# Patient Record
Sex: Male | Born: 1960
Health system: Southern US, Community
[De-identification: ages and names within clinical notes are randomized; demographics above are authoritative.]

## PROBLEM LIST (undated history)

## (undated) DIAGNOSIS — T7840XA Allergy, unspecified, initial encounter: Secondary | ICD-10-CM

## (undated) DIAGNOSIS — E785 Hyperlipidemia, unspecified: Secondary | ICD-10-CM

## (undated) DIAGNOSIS — I10 Essential (primary) hypertension: Secondary | ICD-10-CM

## (undated) DIAGNOSIS — G4730 Sleep apnea, unspecified: Secondary | ICD-10-CM

## (undated) DIAGNOSIS — Z87442 Personal history of urinary calculi: Secondary | ICD-10-CM

## (undated) HISTORY — DX: Personal history of urinary calculi: Z87.442

## (undated) HISTORY — PX: LUMBAR LAMINECTOMY: SHX95

## (undated) HISTORY — DX: Allergy, unspecified, initial encounter: T78.40XA

## (undated) HISTORY — DX: Essential (primary) hypertension: I10

## (undated) HISTORY — DX: Sleep apnea, unspecified: G47.30

## (undated) HISTORY — PX: UVULECTOMY: SHX2631

## (undated) HISTORY — DX: Hyperlipidemia, unspecified: E78.5

## (undated) HISTORY — PX: APPENDECTOMY: SHX54

---

## 2003-12-07 ENCOUNTER — Ambulatory Visit (HOSPITAL_COMMUNITY): Admission: RE | Admit: 2003-12-07 | Discharge: 2003-12-07 | Payer: Self-pay | Admitting: Family Medicine

## 2004-02-09 ENCOUNTER — Encounter: Admission: RE | Admit: 2004-02-09 | Discharge: 2004-02-09 | Payer: Self-pay | Admitting: Otolaryngology

## 2004-02-10 ENCOUNTER — Ambulatory Visit (HOSPITAL_BASED_OUTPATIENT_CLINIC_OR_DEPARTMENT_OTHER): Admission: RE | Admit: 2004-02-10 | Discharge: 2004-02-10 | Payer: Self-pay | Admitting: *Deleted

## 2004-02-10 ENCOUNTER — Encounter (INDEPENDENT_AMBULATORY_CARE_PROVIDER_SITE_OTHER): Payer: Self-pay | Admitting: *Deleted

## 2004-02-10 ENCOUNTER — Ambulatory Visit (HOSPITAL_COMMUNITY): Admission: RE | Admit: 2004-02-10 | Discharge: 2004-02-10 | Payer: Self-pay | Admitting: *Deleted

## 2004-04-29 ENCOUNTER — Encounter: Admission: RE | Admit: 2004-04-29 | Discharge: 2004-04-29 | Payer: Self-pay | Admitting: Sports Medicine

## 2005-12-11 ENCOUNTER — Emergency Department (HOSPITAL_COMMUNITY): Admission: EM | Admit: 2005-12-11 | Discharge: 2005-12-11 | Payer: Self-pay | Admitting: Emergency Medicine

## 2005-12-12 ENCOUNTER — Encounter: Admission: RE | Admit: 2005-12-12 | Discharge: 2005-12-12 | Payer: Self-pay | Admitting: Family Medicine

## 2005-12-27 ENCOUNTER — Encounter: Admission: RE | Admit: 2005-12-27 | Discharge: 2005-12-27 | Payer: Self-pay | Admitting: Family Medicine

## 2006-01-19 ENCOUNTER — Encounter: Admission: RE | Admit: 2006-01-19 | Discharge: 2006-01-19 | Payer: Self-pay | Admitting: Sports Medicine

## 2006-02-15 ENCOUNTER — Ambulatory Visit (HOSPITAL_COMMUNITY): Admission: RE | Admit: 2006-02-15 | Discharge: 2006-02-16 | Payer: Self-pay | Admitting: Neurological Surgery

## 2006-11-27 ENCOUNTER — Encounter: Payer: Self-pay | Admitting: Pulmonary Disease

## 2008-03-17 ENCOUNTER — Ambulatory Visit: Payer: Self-pay | Admitting: Pulmonary Disease

## 2008-03-17 DIAGNOSIS — E785 Hyperlipidemia, unspecified: Secondary | ICD-10-CM | POA: Insufficient documentation

## 2008-03-17 DIAGNOSIS — J309 Allergic rhinitis, unspecified: Secondary | ICD-10-CM | POA: Insufficient documentation

## 2008-03-17 DIAGNOSIS — G4730 Sleep apnea, unspecified: Secondary | ICD-10-CM | POA: Insufficient documentation

## 2008-03-24 ENCOUNTER — Encounter: Payer: Self-pay | Admitting: Pulmonary Disease

## 2009-04-21 ENCOUNTER — Ambulatory Visit: Payer: Self-pay | Admitting: Family Medicine

## 2009-04-21 DIAGNOSIS — Z87442 Personal history of urinary calculi: Secondary | ICD-10-CM | POA: Insufficient documentation

## 2009-04-21 DIAGNOSIS — G4700 Insomnia, unspecified: Secondary | ICD-10-CM | POA: Insufficient documentation

## 2009-04-21 DIAGNOSIS — F5104 Psychophysiologic insomnia: Secondary | ICD-10-CM | POA: Insufficient documentation

## 2009-06-08 ENCOUNTER — Ambulatory Visit: Payer: Self-pay | Admitting: Family Medicine

## 2009-06-08 LAB — CONVERTED CEMR LAB
Glucose, Urine, Semiquant: NEGATIVE
Specific Gravity, Urine: 1.025

## 2009-06-11 LAB — CONVERTED CEMR LAB
Alkaline Phosphatase: 59 units/L (ref 39–117)
BUN: 13 mg/dL (ref 6–23)
Basophils Absolute: 0 10*3/uL (ref 0.0–0.1)
Direct LDL: 233.1 mg/dL
Eosinophils Relative: 1.8 % (ref 0.0–5.0)
GFR calc non Af Amer: 63.18 mL/min (ref >60)
Glucose, Bld: 78 mg/dL (ref 70–99)
Hemoglobin: 18.2 g/dL — ABNORMAL HIGH (ref 13.0–17.0)
Lymphocytes Relative: 20.7 % (ref 12.0–46.0)
MCHC: 32.4 g/dL (ref 30.0–36.0)
MCV: 96.7 fL (ref 78.0–100.0)
Monocytes Absolute: 0.6 10*3/uL (ref 0.1–1.0)
Monocytes Relative: 11.3 % (ref 3.0–12.0)
Neutrophils Relative %: 66 % (ref 43.0–77.0)
Platelets: 204 10*3/uL (ref 150.0–400.0)
Sodium: 139 meq/L (ref 135–145)
TSH: 1.47 microintl units/mL (ref 0.35–5.50)
Total Bilirubin: 1.9 mg/dL — ABNORMAL HIGH (ref 0.3–1.2)
Total CHOL/HDL Ratio: 13
Total Protein: 6.2 g/dL (ref 6.0–8.3)
Triglycerides: 82 mg/dL (ref 0.0–149.0)
VLDL: 16.4 mg/dL (ref 0.0–40.0)
WBC: 5 10*3/uL (ref 4.5–10.5)

## 2009-06-14 ENCOUNTER — Ambulatory Visit: Payer: Self-pay | Admitting: Family Medicine

## 2009-06-14 DIAGNOSIS — F411 Generalized anxiety disorder: Secondary | ICD-10-CM | POA: Insufficient documentation

## 2009-08-06 ENCOUNTER — Telehealth: Payer: Self-pay | Admitting: Family Medicine

## 2009-10-11 ENCOUNTER — Encounter: Payer: Self-pay | Admitting: Family Medicine

## 2009-10-15 ENCOUNTER — Ambulatory Visit: Payer: Self-pay | Admitting: Family Medicine

## 2009-10-15 DIAGNOSIS — I10 Essential (primary) hypertension: Secondary | ICD-10-CM | POA: Insufficient documentation

## 2009-10-22 ENCOUNTER — Encounter: Payer: Self-pay | Admitting: Family Medicine

## 2009-12-07 ENCOUNTER — Encounter: Payer: Self-pay | Admitting: Family Medicine

## 2010-04-08 ENCOUNTER — Encounter: Payer: Self-pay | Admitting: Family Medicine

## 2010-04-15 ENCOUNTER — Encounter: Admission: RE | Admit: 2010-04-15 | Discharge: 2010-04-15 | Payer: Self-pay | Admitting: Cardiology

## 2010-04-19 ENCOUNTER — Telehealth: Payer: Self-pay | Admitting: Family Medicine

## 2010-06-20 ENCOUNTER — Encounter: Payer: Self-pay | Admitting: Family Medicine

## 2010-10-19 ENCOUNTER — Telehealth: Payer: Self-pay | Admitting: Family Medicine

## 2010-10-22 ENCOUNTER — Encounter: Payer: Self-pay | Admitting: Endocrinology

## 2010-10-23 ENCOUNTER — Encounter: Payer: Self-pay | Admitting: Cardiology

## 2010-11-03 NOTE — Letter (Signed)
Summary: [facility]   Imported By: Maryln Gottron 10/20/2009 12:26:56  _____________________________________________________________________  External Attachment:    Type:   Image     Comment:   External Document

## 2010-11-03 NOTE — Assessment & Plan Note (Signed)
Summary: COUGH, CONGESTION // RS   Vital Signs:  Patient profile:   50 year old male Weight:      173 pounds Temp:     98.2 degrees F oral Pulse rate:   79 / minute BP sitting:   100 / 66  (left arm) Cuff size:   regular  Vitals Entered By: Alfred Levins, CMA (October 15, 2009 9:31 AM) CC: cough x2 wks   History of Present Illness: Here for 2 weeks of stuffy head, ST, chest congestion, and coughing up green sputum. No fever. Prior to this he developed some chest pains and saw Dr. Lynnea Ferrier at [facility]. These pains do not sound cardiac to me, but hey are doing a full work up. He was switched from Crestor to Zetia, and he was started on Bystolic for his BP. The pains are mild but sharp, they only last about 5 seconds, they are not related to exertion, and there are no associated symptoms. He is scheduled for a stress test and an ECHO soon. Since we increased his Zoloft to 100mg  a day, he has felt much more relaxed and happy.   Current Medications (verified): 1)  Trazodone Hcl 50 Mg Tabs (Trazodone Hcl) .... At Bedtime 2)  Zolpidem Tartrate 10 Mg Tabs (Zolpidem Tartrate) .... At Bedtime As Needed 3)  Zoloft 100 Mg Tabs (Sertraline Hcl) .Marland Kitchen.. 1 By Mouth Daily 4)  Zetia 10 Mg Tabs (Ezetimibe) .Marland Kitchen.. 1 By Mouth Daily 5)  Fish Oil 1000 Mg Caps (Omega-3 Fatty Acids) .... Once Daily 6)  Bystolic 10 Mg  Tabs (Nebivolol Hcl) .Marland Kitchen.. 1 By Mouth Daily  Allergies (verified): No Known Drug Allergies  Past History:  Past Medical History: SLEEP APNEA (ICD-780.57), sees Dr. Shelle Iron, does not require CPAP HYPERLIPIDEMIA (ICD-272.4) ALLERGIC RHINITIS (ICD-477.9) Chickenpox Nephrolithiasis, hx of insomnia Hypertension, sees Dr. Ritta Slot  Review of Systems  The patient denies anorexia, fever, weight loss, weight gain, vision loss, decreased hearing, hoarseness, syncope, dyspnea on exertion, peripheral edema, headaches, hemoptysis, abdominal pain, melena, hematochezia, severe  indigestion/heartburn, hematuria, incontinence, genital sores, muscle weakness, suspicious skin lesions, transient blindness, difficulty walking, depression, unusual weight change, abnormal bleeding, enlarged lymph nodes, angioedema, breast masses, and testicular masses.    Physical Exam  General:  Well-developed,well-nourished,in no acute distress; alert,appropriate and cooperative throughout examination Head:  Normocephalic and atraumatic without obvious abnormalities. No apparent alopecia or balding. Eyes:  No corneal or conjunctival inflammation noted. EOMI. Perrla. Funduscopic exam benign, without hemorrhages, exudates or papilledema. Vision grossly normal. Ears:  External ear exam shows no significant lesions or deformities.  Otoscopic examination reveals clear canals, tympanic membranes are intact bilaterally without bulging, retraction, inflammation or discharge. Hearing is grossly normal bilaterally. Nose:  External nasal examination shows no deformity or inflammation. Nasal mucosa are pink and moist without lesions or exudates. Mouth:  Oral mucosa and oropharynx without lesions or exudates.  Teeth in good repair. Neck:  No deformities, masses, or tenderness noted. Chest Wall:  No deformities, masses, tenderness or gynecomastia noted. Lungs:  Normal respiratory effort, chest expands symmetrically. Lungs are clear to auscultation, no crackles or wheezes. Heart:  Normal rate and regular rhythm. S1 and S2 normal without gallop, murmur, click, rub or other extra sounds.   Impression & Recommendations:  Problem # 1:  ACUTE BRONCHITIS (ICD-466.0)  His updated medication list for this problem includes:    Zithromax Z-pak 250 Mg Tabs (Azithromycin) .Marland Kitchen... As directed    Hydromet 5-1.5 Mg/68ml Syrp (Hydrocodone-homatropine) .Marland Kitchen... 1 tsp q  4 hours as needed cough  Problem # 2:  CHEST PAIN (ICD-786.50)  Problem # 3:  HYPERTENSION (ICD-401.9)  His updated medication list for this problem  includes:    Bystolic 10 Mg Tabs (Nebivolol hcl) .Marland Kitchen... 1 by mouth daily  Problem # 4:  GENERALIZED ANXIETY DISORDER (ICD-300.02)  His updated medication list for this problem includes:    Trazodone Hcl 50 Mg Tabs (Trazodone hcl) .Marland Kitchen... At bedtime    Zoloft 100 Mg Tabs (Sertraline hcl) .Marland Kitchen... 1 by mouth daily  Problem # 5:  INSOMNIA (ICD-780.52)  His updated medication list for this problem includes:    Zolpidem Tartrate 10 Mg Tabs (Zolpidem tartrate) .Marland Kitchen... At bedtime as needed  Complete Medication List: 1)  Trazodone Hcl 50 Mg Tabs (Trazodone hcl) .... At bedtime 2)  Zolpidem Tartrate 10 Mg Tabs (Zolpidem tartrate) .... At bedtime as needed 3)  Zoloft 100 Mg Tabs (Sertraline hcl) .Marland Kitchen.. 1 by mouth daily 4)  Zetia 10 Mg Tabs (Ezetimibe) .Marland Kitchen.. 1 by mouth daily 5)  Fish Oil 1000 Mg Caps (Omega-3 fatty acids) .... Once daily 6)  Bystolic 10 Mg Tabs (Nebivolol hcl) .Marland Kitchen.. 1 by mouth daily 7)  Zithromax Z-pak 250 Mg Tabs (Azithromycin) .... As directed 8)  Hydromet 5-1.5 Mg/57ml Syrp (Hydrocodone-homatropine) .Marland Kitchen.. 1 tsp q 4 hours as needed cough  Patient Instructions: 1)  Please schedule a follow-up appointment as needed .  Prescriptions: HYDROMET 5-1.5 MG/5ML SYRP (HYDROCODONE-HOMATROPINE) 1 tsp q 4 hours as needed cough  #240 x 0   Entered and Authorized by:   Nelwyn Salisbury MD   Signed by:   Nelwyn Salisbury MD on 10/15/2009   Method used:   Print then Give to Patient   RxID:   [id number] ZOLPIDEM TARTRATE 10 MG TABS (ZOLPIDEM TARTRATE) at bedtime as needed  #30 x 5   Entered and Authorized by:   Nelwyn Salisbury MD   Signed by:   Nelwyn Salisbury MD on 10/15/2009   Method used:   Print then Give to Patient   RxID:   [id number] QIONGEXBM Z-PAK 250 MG TABS (AZITHROMYCIN) as directed  #1 x 0   Entered and Authorized by:   Nelwyn Salisbury MD   Signed by:   Nelwyn Salisbury MD on 10/15/2009   Method used:   Print then Give to Patient   RxID:   [id number]

## 2010-11-03 NOTE — Progress Notes (Signed)
Summary: refill zolpidem  Phone Note Refill Request Message from:  Fax from Pharmacy on April 19, 2010 10:38 AM  Refills Requested: Medication #1:  ZOLPIDEM TARTRATE 10 MG TABS at bedtime as needed   Dosage confirmed as above?Dosage Confirmed   Supply Requested: 1 month   Last Refilled: 03/17/2010  Method Requested: Fax to Local Pharmacy Initial call taken by: Raechel Ache, RN,  April 19, 2010 10:38 AM Caller: [facility] [address] [id number]*  Follow-up for Phone Call        call in #30 with 5 rf Follow-up by: Nelwyn Salisbury MD,  April 19, 2010 3:47 PM  Additional Follow-up for Phone Call Additional follow up Details #1::        Rx faxed to pharmacy Additional Follow-up by: Raechel Ache, RN,  April 19, 2010 3:49 PM    rePrescriptions: ZOLPIDEM TARTRATE 10 MG TABS (ZOLPIDEM TARTRATE) at bedtime as needed  #30 x 5   Entered by:   Raechel Ache, RN   Authorized by:   Nelwyn Salisbury MD   Signed by:   Raechel Ache, RN on 04/19/2010   Method used:   Historical   RxID:   [id number]

## 2010-11-03 NOTE — Letter (Signed)
Summary: [facility]  [facility]   Imported By: Maryln Gottron 05/05/2010 10:29:35  _____________________________________________________________________  External Attachment:    Type:   Image     Comment:   External Document

## 2010-11-03 NOTE — Letter (Signed)
Summary: [facility]   Imported By: Maryln Gottron 08/15/2010 15:36:28  _____________________________________________________________________  External Attachment:    Type:   Image     Comment:   External Document

## 2010-11-03 NOTE — Progress Notes (Signed)
Summary: refill zolpidem  Phone Note Refill Request Message from:  Fax from Pharmacy on October 19, 2010 4:32 PM  Refills Requested: Medication #1:  ZOLPIDEM TARTRATE 10 MG TABS at bedtime as needed   Last Refilled: 09/18/[facility] [address]   Method Requested: Fax to Local Pharmacy Initial call taken by: Duard Brady LPN,  October 19, 2010 4:32 PM  Follow-up for Phone Call        last seen 10/15/2009 Follow-up by: Duard Brady LPN,  October 19, 2010 4:38 PM  Additional Follow-up for Phone Call Additional follow up Details #1::        call in #30 with no rf. He needs an OV for any after that  Additional Follow-up by: Nelwyn Salisbury MD,  October 19, 2010 5:30 PM    Prescriptions: ZOLPIDEM TARTRATE 10 MG TABS (ZOLPIDEM TARTRATE) at bedtime as needed  #30 x 0   Entered by:   Duard Brady LPN   Authorized by:   Nelwyn Salisbury MD   Signed by:   Duard Brady LPN on 36/64/4034   Method used:   Historical   RxID:   [id number]  [facility]   [facility]

## 2010-11-03 NOTE — Letter (Signed)
Summary: [facility]  [facility]   Imported By: Maryln Gottron 12/14/2009 15:46:28  _____________________________________________________________________  External Attachment:    Type:   Image     Comment:   External Document

## 2010-11-09 ENCOUNTER — Encounter: Payer: Self-pay | Admitting: Family Medicine

## 2010-11-28 ENCOUNTER — Other Ambulatory Visit: Payer: BC Managed Care – PPO | Admitting: Family Medicine

## 2010-11-28 DIAGNOSIS — Z0000 Encounter for general adult medical examination without abnormal findings: Secondary | ICD-10-CM

## 2010-11-28 LAB — HEPATIC FUNCTION PANEL
ALT: 20 U/L (ref 0–53)
AST: 16 U/L (ref 0–37)
Albumin: 3.9 g/dL (ref 3.5–5.2)
Alkaline Phosphatase: 49 U/L (ref 39–117)
Bilirubin, Direct: 0.1 mg/dL (ref 0.0–0.3)

## 2010-11-28 LAB — BASIC METABOLIC PANEL
BUN: 12 mg/dL (ref 6–23)
GFR: 118.43 mL/min (ref >60.00)
Glucose, Bld: 78 mg/dL (ref 70–99)
Potassium: 4.3 mEq/L (ref 3.5–5.1)

## 2010-11-28 LAB — CBC WITH DIFFERENTIAL/PLATELET
Eosinophils Relative: 4.9 % (ref 0.0–5.0)
HCT: 49.6 % (ref 39.0–52.0)
Hemoglobin: 17.3 g/dL — ABNORMAL HIGH (ref 13.0–17.0)
Lymphocytes Relative: 24 % (ref 12.0–46.0)
MCHC: 34.8 g/dL (ref 30.0–36.0)
Monocytes Relative: 12 % (ref 3.0–12.0)
Neutrophils Relative %: 58.7 % (ref 43.0–77.0)
Platelets: 170 10*3/uL (ref 150.0–400.0)
RDW: 13.4 % (ref 11.5–14.6)

## 2010-11-28 LAB — LIPID PANEL
Cholesterol: 197 mg/dL (ref 0–200)
HDL: 28.8 mg/dL — ABNORMAL LOW (ref >39.00)
Total CHOL/HDL Ratio: 7
Triglycerides: 100 mg/dL (ref 0.0–149.0)

## 2010-11-28 LAB — POCT URINALYSIS DIPSTICK
Blood, UA: NEGATIVE
Glucose, UA: NEGATIVE
Urobilinogen, UA: 0.2

## 2010-11-28 LAB — TSH: TSH: 2.07 u[IU]/mL (ref 0.35–5.50)

## 2010-11-29 ENCOUNTER — Telehealth: Payer: Self-pay | Admitting: *Deleted

## 2010-11-29 NOTE — Telephone Encounter (Signed)
Refill zolpidem 10 mg 1 po qhs 

## 2010-11-30 NOTE — Telephone Encounter (Signed)
No, he needs an OV first 

## 2010-11-30 NOTE — Telephone Encounter (Signed)
Denial faxed back 

## 2010-12-01 ENCOUNTER — Telehealth: Payer: Self-pay | Admitting: *Deleted

## 2010-12-01 NOTE — Telephone Encounter (Signed)
Message copied by Tor Netters on Thu Dec 01, 2010  3:52 PM ------      Message from: Dwaine Deter      Created: Tue Nov 29, 2010  9:50 AM       Normal except high chol. Watch the diet

## 2010-12-01 NOTE — Telephone Encounter (Signed)
Left message to call back  

## 2010-12-05 ENCOUNTER — Encounter: Payer: Self-pay | Admitting: Family Medicine

## 2010-12-05 ENCOUNTER — Ambulatory Visit (INDEPENDENT_AMBULATORY_CARE_PROVIDER_SITE_OTHER): Payer: BC Managed Care – PPO | Admitting: Family Medicine

## 2010-12-05 VITALS — BP 110/80 | HR 70 | Temp 98.4°F | Ht 66.75 in | Wt 172.0 lb

## 2010-12-05 DIAGNOSIS — Z8249 Family history of ischemic heart disease and other diseases of the circulatory system: Secondary | ICD-10-CM

## 2010-12-05 DIAGNOSIS — Z0000 Encounter for general adult medical examination without abnormal findings: Secondary | ICD-10-CM

## 2010-12-05 MED ORDER — ZOLPIDEM TARTRATE 10 MG PO TABS: 10.0000 mg | ORAL_TABLET | Freq: Every evening | ORAL | Status: DC | PRN

## 2010-12-05 NOTE — Progress Notes (Signed)
  Subjective:    Patient ID: [name], male    DOB: 06-12-1961, 50 y.o.   MRN: [id number]  HPI 50 yr old male for a cpx. He feel fine. Exercises and watches his diet. He would like to switch from Dr. Lynnea Ferrier to a [place] cardiologist.    Review of Systems  Constitutional: Negative.   HENT: Negative.   Eyes: Negative.   Respiratory: Negative.   Cardiovascular: Negative.   Gastrointestinal: Negative.   Genitourinary: Negative.   Musculoskeletal: Negative.   Skin: Negative.   Neurological: Negative.   Hematological: Negative.   Psychiatric/Behavioral: Negative.        Objective:   Physical Exam  Constitutional: He is oriented to person, place, and time. He appears well-developed and well-nourished. No distress.  HENT:  Head: Normocephalic and atraumatic.  Right Ear: External ear normal.  Left Ear: External ear normal.  Nose: Nose normal.  Mouth/Throat: Oropharynx is clear and moist. No oropharyngeal exudate.  Eyes: Conjunctivae and EOM are normal. Pupils are equal, round, and reactive to light. Right eye exhibits no discharge. Left eye exhibits no discharge. No scleral icterus.  Neck: Neck supple. No JVD present. No tracheal deviation present. No thyromegaly present.  Cardiovascular: Normal rate, regular rhythm, normal heart sounds and intact distal pulses.  Exam reveals no gallop and no friction rub.   No murmur heard. Pulmonary/Chest: Effort normal and breath sounds normal. No respiratory distress. He has no wheezes. He has no rales. He exhibits no tenderness.  Abdominal: Soft. Bowel sounds are normal. He exhibits no distension and no mass. There is no tenderness. There is no rebound and no guarding.  Genitourinary: Rectum normal and penis normal. No penile tenderness.  Musculoskeletal: Normal range of motion. He exhibits no edema and no tenderness.  Lymphadenopathy:    He has no cervical adenopathy.  Neurological: He is alert and oriented to person, place, and time. He  has normal reflexes. No cranial nerve deficit. He exhibits normal muscle tone. Coordination normal.  Skin: Skin is warm and dry. No rash noted. He is not diaphoretic. No erythema. No pallor.  Psychiatric: He has a normal mood and affect. His behavior is normal. Judgment and thought content normal.          Assessment & Plan:  Doing well

## 2010-12-05 NOTE — Telephone Encounter (Signed)
Left message on machine for patient  To return our call 

## 2011-02-17 NOTE — H&P (Signed)
NAME:  [name], [name]                         ACCOUNT NO.:  [id number]   MEDICAL RECORD NO.:  [id number]                   PATIENT TYPE:  AMB   LOCATION:  DSC                                  FACILITY:  [facility]   PHYSICIAN:  Hermelinda Medicus, M.D.                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  He is outdoors a  great deal.  He is exercising a great deal.  He used to wrestle years ago  and had some considerable nasal injuries and has a severe septal deviation  where the left side of his nose is essentially totally obstructed and the  right side is just wide open with more space than the nares could ever  accept.  He also has a concha bullosa and turbinate hypertrophy.  His  tonsils are present but are very small.  He is quite a severe snorer and  makes a lot of noise but his wife has never said that she was worried about  him.  He never obstructed for any moderate amount of time.  He now enters  for a septal reconstruction, turbinate reduction, and an LAUP under local  MAC anesthesia standby.   His physical examination reveals a blood pressure of 137/78, he is 5 feet 7  and weighs 165, heart rate 93.  He does not smoke or drink.  He had an  appendectomy as a child.  No cardiac problem though he did have apparently a  Cardiolite study done by Dr. Charlton Haws in 1997 getting off anabolic  steroids.  It was a negative stress Cardiolite study.  Neurologic,  pulmonary, and cardiac are all unremarkable.  ENT was the only problem; the  septal deviation, turbinate hypertrophy and the low palate with redundant  uvula and palate.   MEDICATIONS:  Allegra.   ALLERGIES:  No allergies to medication.   PHYSICAL EXAMINATION:  HEENT:  Furthermore is one of ears are clear.  The  tympanic membranes are clear.  The oral cavity is clear but he has  a low  palate and quite a large uvula.  His septum is deviated to the left as above  mentioned.  CHEST:  Clear.  No rales, rhonchi, or wheezes.  CARDIOVASCULAR:  __________ murmurs or gallops.  The above-mentioned  Cardiolite stress test was negative.  NECK:  Free of any thyromegaly, cervical adenopathy, or mass.  ABDOMEN:  Unremarkable.  EXTREMITIES:  Unremarkable.   INITIAL DIAGNOSIS:  Snoring with septal deviation with nasal obstruction.   PLAN:  A septal reconstruction, turbinate reduction, with an LAUP.  Hermelinda Medicus, M.D.   JC/MEDQ  D:  02/10/2004  T:  02/10/2004  Job:  [id number]   cc:   [facility]  [place], [place]

## 2011-02-17 NOTE — Op Note (Signed)
[name], [name]               ACCOUNT NO.:  [id number]   MEDICAL RECORD NO.:  [id number]          PATIENT TYPE:  OIB   LOCATION:  3012                         FACILITY:  [facility]   PHYSICIAN:  Stefani Dama, M.D.  DATE OF BIRTH:  1961/05/25   DATE OF PROCEDURE:  02/15/2006  DATE OF DISCHARGE:                                 OPERATIVE REPORT   PREOPERATIVE DIAGNOSIS:  Herniated nucleus pulposus L5-S1 right with right  lumbar radiculopathy.   POSTOPERATIVE DIAGNOSIS:  Herniated nucleus post cell five S1 right with  right lumbar radiculopathy.   PROCEDURE:  METRx microdiskectomy L5-S1 right with operating microscope,  microdissection  technique.   SURGEON:  Dr. Danielle Dess.   FIRST ASSISTANT:  Dr. Jeral Fruit.   ANESTHESIA:  General endotracheal.   INDICATIONS:  [name] is a 50 year old right-handed individual who has  had significant back and right lower extremity pain for several months' time  now.  He was found to have a herniated nucleus pulposus at L5-S1 on the  right-hand side.  He tried every manner of conservative management, but  noticed that with any significant exercise or increase in activity, he has  pain radiating down to right buttock and into the right calf and right lower  extremity.  Because of the persistence of pain, he was reevaluated here  recently; and we discussed the prospect of surgical decompression of the S1  nerve root.  He is now taken to the operating room for this procedure.   PROCEDURE:  The patient was brought to the operating room supine on the  stretcher.  After a smooth induction of general endotracheal anesthesia, he  was turned prone.  The back was cleansed with Hibiclens, then prepped with  DuraPrep and draped in sterile fashion.  Fluoroscopic guidance was used to  localize the L5-S1 space on the right side.  The skin above this area was  infiltrated with 20 mL total of 1% lidocaine with epinephrine mixed 50:50  with 0.5% Marcaine.  An  incision was made overlying the space chosen, and  then a K-wire was passed to the laminar arch of L5 on the left side.  Then  using a winding technique, a series of dilators were passed over the K-wire  and the wound was dilated open to an 18-mm size.  Ultimately an 18-mm x 5 cm  deep endoscopic cannula was placed in the interlaminar space at L5-S1 on the  right side with a with the cannula being fixed to the operating table with a  clamp.  The inner cannulas were removed.  The microscope was draped and  brought into the field, and through this aperture then the soft tissues were  cleared from the lamina of L5 and the interlaminar space at L5-S1.  The  laminotomy was created removing the inferior margin of the lamina of L5 out  to the mesial walls facette.  A mesial facetectomy was also performed, and  then the yellow ligament was taken up.  Underneath the yellow ligament,  there was noted be a very thinned appearing dura.  Initially it was thought  that this represented arachnoid.  Epidural fat was encountered in this  region, and the fat was noted be particularly wet with the sheen of spinal  fluid in this region.  The dissection was undertaken very carefully and Dr.  Jeral Fruit helped by providing some lateral retraction of the dura.  We  carefully inspected this area in the lateral aspect of the sac, which was  also noted be severely thinned and very translucent.  It was retracted  medially.  The lateral aspect of the ligament was taken up with a 2 and a 3-  mm Kerrison punch.  Epidural veins in this area were cauterized and divided.  With care then the S1 nerve root was identified, and this was retracted  medially.  Just distal to this and just this below the disk space, there was  identified a small lump underneath the ligament.  This was incised with a  #15 blade, and under modest amount of pressure some disk material had  extruded itself.  The extruded disk material was taken up.  The  posterior  longitudinal ligament opening was enlarged and several fragments of disk  material were encountered in this area.  Hemostasis in the surrounding area  was obtained with bipolar cautery.  The ligaments were trimmed back.  The  disk space was attempted to be instrumented; however, this was noted be  cephalad, and the ligament over it was noted be quite intact.  By probing  around in both medial and lateral directions, no other fragments of disk  could be identified.  The nerve root itself was noted to be well  decompressed.  The thin dura still had this appearance all over the L5-S1  space; and because of this, it was felt that placing a Tisseel patch over  this area would be beneficial in an effort to try to minimize the chance of  any substantial postoperative spinal fluid leak.  Once the dissection was  completed, a Tisseel patch was placed over the interlaminar space at L5-S1  on the right.  Microscope was withdrawn from the field.  The endoscopic  cannula was removed.  The fascia was closed with a singular stitch of 3-0  Vicryl in interrupted fashion.  Vicryl 3-0 was used to close the  subcuticular tissues, and Dermabond was placed on the skin.   BLOOD LOSS FOR THE PROCEDURE:  Less than 50 mL.   The patient tolerated procedure well and was returned to recovery room in  stable condition.  Again, no discrete dural tears were identified during  this procedure.      Stefani Dama, M.D.  Electronically Signed     HJE/MEDQ  D:  02/15/2006  T:  02/16/2006  Job:  [id number]

## 2011-02-21 NOTE — Op Note (Signed)
NAME:  [name], [name]                         ACCOUNT NO.:  [id number]   MEDICAL RECORD NO.:  [id number]                   PATIENT TYPE:  AMB   LOCATION:  DSC                                  FACILITY:  [facility]   PHYSICIAN:  Hermelinda Medicus, M.D.                DATE OF BIRTH:  1961-01-07   DATE OF PROCEDURE:  02/10/2004  DATE OF DISCHARGE:                                 OPERATIVE REPORT   PREOPERATIVE DIAGNOSES:  Septal deviation and turbinate hypertrophy with  nasal obstruction, history of nasal trauma, with severe snoring, with  redundant uvula and palate.   POSTOPERATIVE DIAGNOSES:  Septal deviation and turbinate hypertrophy with  nasal obstruction, history of nasal trauma, with severe snoring, with  redundant uvula and palate.   OPERATION:  1. Septal reconstruction.  2. Turbinate reduction.  3. Reduction of concha bullosa, right, with an laser-assisted     uvulopalatoplasty.   OPERATOR:  Hermelinda Medicus, M.D.   ANESTHESIA:  Local 1% Xylocaine with epinephrine and topical cocaine 200 mg  and Cetacaine, with MAC with Zenon Mayo, M.D.   PROCEDURE:  The patient was placed in the supine position and under local  anesthesia was prepped and draped in the appropriate manner with the usual  Hibiclens and hand drape and the usual above-mentioned local anesthesia.  The septum was first approached.  The turbinates inferiorly were addressed  and laterally were reduced.  The concha bullosa was also reduced on the  right side using a polyp forceps.  A hemitransfixion incision was made on  the right, carried around the columella to the left, and carried down to the  perichondrium, and this was elevated back to the posterior quadrilateral  cartilage, where a strip of cartilage was taken posteriorly and a strip from  the floor of the nose as the cartilage was just bowed severely, not only  because of a history of trauma but it was bowed because it was under stress,  bowing it to  the left.  This was corrected.  The cartilage was then set back  on the premaxillary crest and a 4 mm chisel was used to take down the  premaxillary spurring on the left side.  The attention was then carried to  the ethmoid septum, where the open and closed Jansen-Middletons were used  and this was corrected by removing a portion of ethmoid and vomerine septum.  The remainder of the vomerine septum was able to be pushed back to the  midline using the butter knife.  The septum was then established in the  midline and the turbinates were reduced, and the closure was begun using 5-0  plain catgut and then with a 4-0 plain x2 as a through-and-through blanket  stitch.  Once this was completed, attention was then carried to the oral  cavity.  Glasses were applied.  The patient's eyes were protected.  The  uvula and palate were  then approached and the laser was used, 10 watts, four  minutes, continuous.  The lateral angled incisions were made lateral to the  uvula on each side, and also the uvula was trimmed, making it approximately  half its size as it was grossly oversized because of the snoring.  Once this  was completed, the closure of the anterior and posterior mucous membrane of  the uvula was completed using 5-0 plain catgut.  Once this was completed,  then the oropharynx was cleared.  The nose was then re-suctioned and Merocel  packs were placed without difficulty.  The patient tolerated the procedure  very well and will be seen postop tomorrow in my office for removal of  packs.  The patient is doing well postop.   Follow-up will be in one day, then in one week, three weeks, six weeks, and  six months.                                               Hermelinda Medicus, M.D.    JC/MEDQ  D:  02/10/2004  T:  02/11/2004  Job:  [id number]   cc:   [facility]

## 2011-02-21 NOTE — Op Note (Signed)
NAME:  [name], [name]                         ACCOUNT NO.:  [id number]   MEDICAL RECORD NO.:  [id number]                   PATIENT TYPE:  AMB   LOCATION:  [facility]                                  FACILITY:  [facility]   PHYSICIAN:  Hermelinda Medicus, M.D.                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   POSTOPERATIVE DIAGNOSIS:  Septal deviation, turbinate hypertrophy with  concha bullosa with low palate and uvula with considerable snoring.   OPERATION PERFORMED:  Septal reconstruction and turbinate reduction with a  laser assisted uvulopalatoplasty, 10 watts continuous four minutes.   SURGEON:  Hermelinda Medicus, M.D.   ANESTHESIA:  Local 1% Xylocaine with epinephrine and topical cocaine 180 mg  and 1% Xylocaine with epinephrine.  Also Cetacaine spray on the palate.   ANESTHESIOLOGIST:  Zenon Mayo, MD   DESCRIPTION OF PROCEDURE:  Patient placed in supine position.  Under local  anesthesia he was prepped and draped in the appropriate manner using  Hibiclens times three and the usual head drape.  The nose was first  approached where anesthesia was instilled.  The septum was grossly deviated  to the left.  A hemitransfixion incision was made on the right side after  appropriate prepping and draping.  Anesthesia was completed and carried  around the columella to the left side and then worked along the floor of the  nose, along the quadrilateral cartilage.  We took a strip of cartilage from  the floor of the nose as the cartilage was so bowed and deviated almost like  an area of cartilage that was just too big for the area of his nose.   DICTATION ENDED AT THIS POINT.                                               Hermelinda Medicus, M.D.    JC/MEDQ   D:  02/10/2004  T:  02/11/2004  Job:  [id number]   cc:   Del[facility]

## 2011-06-01 ENCOUNTER — Other Ambulatory Visit: Payer: Self-pay | Admitting: Family Medicine

## 2011-06-16 ENCOUNTER — Telehealth: Payer: Self-pay | Admitting: Family Medicine

## 2011-06-16 NOTE — Telephone Encounter (Signed)
Call in #30 with 5 rf 

## 2011-06-16 NOTE — Telephone Encounter (Signed)
Refill request for Zolpidem Tartrate 10 mg take 1 po qhs prn. Pt last here on 12/15/10 and script last filled on 05/09/11.

## 2011-06-21 MED ORDER — ZOLPIDEM TARTRATE 10 MG PO TABS: 10.0000 mg | ORAL_TABLET | Freq: Every evening | ORAL | Status: DC | PRN

## 2011-06-21 NOTE — Telephone Encounter (Signed)
Script called in

## 2011-07-18 ENCOUNTER — Encounter: Payer: Self-pay | Admitting: Family Medicine

## 2011-07-18 ENCOUNTER — Ambulatory Visit (INDEPENDENT_AMBULATORY_CARE_PROVIDER_SITE_OTHER): Payer: BC Managed Care – PPO | Admitting: Family Medicine

## 2011-07-18 VITALS — BP 118/78 | HR 102 | Temp 98.2°F | Wt 169.0 lb

## 2011-07-18 DIAGNOSIS — J40 Bronchitis, not specified as acute or chronic: Secondary | ICD-10-CM

## 2011-07-18 MED ORDER — AZITHROMYCIN 250 MG PO TABS: ORAL_TABLET | ORAL | Status: AC

## 2011-07-18 NOTE — Progress Notes (Signed)
  Subjective:    Patient ID: NAGEE GOATES, male    DOB: [birth year], 50 y.o.   MRN: [id number]  HPI Here for one week of stuffy head, chest congestion, and a dry cough. No fever.    Review of Systems  Constitutional: Negative.   HENT: Positive for congestion, postnasal drip and sinus pressure.   Eyes: Negative.   Respiratory: Positive for cough.        Objective:   Physical Exam  Constitutional: He appears well-developed and well-nourished.  HENT:  Right Ear: External ear normal.  Left Ear: External ear normal.  Nose: Nose normal.  Mouth/Throat: Oropharynx is clear and moist. No oropharyngeal exudate.  Eyes: Conjunctivae are normal. Pupils are equal, round, and reactive to light.  Neck: Neck supple. No thyromegaly present.  Pulmonary/Chest: Effort normal and breath sounds normal. No respiratory distress. He has no wheezes. He has no rales. He exhibits no tenderness.  Lymphadenopathy:    He has no cervical adenopathy.          Assessment & Plan:  Recheck prn

## 2011-09-22 ENCOUNTER — Other Ambulatory Visit: Payer: Self-pay | Admitting: Family Medicine

## 2011-12-19 ENCOUNTER — Telehealth: Payer: Self-pay

## 2011-12-19 NOTE — Telephone Encounter (Signed)
Rx request for zolpidem tartrate 10 mg.  Rx last filled 06/21/11 #30 x 5 rf.  Pt last seen 07/18/11.

## 2011-12-21 MED ORDER — ZOLPIDEM TARTRATE 10 MG PO TABS: 10.0000 mg | ORAL_TABLET | Freq: Every evening | ORAL | Status: DC | PRN

## 2011-12-21 NOTE — Telephone Encounter (Signed)
Call in #30 with 5 rf 

## 2011-12-21 NOTE — Telephone Encounter (Signed)
Script called in

## 2012-05-23 ENCOUNTER — Other Ambulatory Visit: Payer: Self-pay | Admitting: Family Medicine

## 2012-07-03 ENCOUNTER — Other Ambulatory Visit: Payer: Self-pay | Admitting: Family Medicine

## 2012-07-03 NOTE — Telephone Encounter (Signed)
Call in #30 with 2 rf. He will need an OV after that

## 2012-09-06 ENCOUNTER — Other Ambulatory Visit: Payer: Self-pay | Admitting: Family Medicine

## 2012-09-06 NOTE — Telephone Encounter (Signed)
Call in #30 with no rf. He needs an OV

## 2012-10-16 ENCOUNTER — Other Ambulatory Visit (INDEPENDENT_AMBULATORY_CARE_PROVIDER_SITE_OTHER): Payer: BC Managed Care – PPO

## 2012-10-16 DIAGNOSIS — Z0000 Encounter for general adult medical examination without abnormal findings: Secondary | ICD-10-CM

## 2012-10-16 LAB — POCT URINALYSIS DIPSTICK
Blood, UA: NEGATIVE
Glucose, UA: NEGATIVE
Ketones, UA: NEGATIVE
Nitrite, UA: NEGATIVE
Urobilinogen, UA: 1
pH, UA: 6

## 2012-10-16 LAB — LIPID PANEL
Cholesterol: 244 mg/dL — ABNORMAL HIGH (ref 0–200)
HDL: 43.6 mg/dL (ref >39.00)
Triglycerides: 119 mg/dL (ref 0.0–149.0)
VLDL: 23.8 mg/dL (ref 0.0–40.0)

## 2012-10-16 LAB — CBC WITH DIFFERENTIAL/PLATELET
Basophils Absolute: 0.1 10*3/uL (ref 0.0–0.1)
Eosinophils Absolute: 0.1 10*3/uL (ref 0.0–0.7)
HCT: 44.6 % (ref 39.0–52.0)
MCHC: 34.2 g/dL (ref 30.0–36.0)
Monocytes Absolute: 0.6 10*3/uL (ref 0.1–1.0)
Neutro Abs: 3.4 10*3/uL (ref 1.4–7.7)
Neutrophils Relative %: 65.5 % (ref 43.0–77.0)
Platelets: 148 10*3/uL — ABNORMAL LOW (ref 150.0–400.0)
RBC: 4.9 Mil/uL (ref 4.22–5.81)
WBC: 5.2 10*3/uL (ref 4.5–10.5)

## 2012-10-16 LAB — HEPATIC FUNCTION PANEL
Bilirubin, Direct: 0.1 mg/dL (ref 0.0–0.3)
Total Protein: 6.6 g/dL (ref 6.0–8.3)

## 2012-10-16 LAB — BASIC METABOLIC PANEL
Calcium: 9.2 mg/dL (ref 8.4–10.5)
Chloride: 103 mEq/L (ref 96–112)
Creatinine, Ser: 0.9 mg/dL (ref 0.4–1.5)
GFR: 96.44 mL/min (ref >60.00)
Glucose, Bld: 89 mg/dL (ref 70–99)
Potassium: 3.6 mEq/L (ref 3.5–5.1)
Sodium: 137 mEq/L (ref 135–145)

## 2012-10-16 LAB — TSH: TSH: 1.69 u[IU]/mL (ref 0.35–5.50)

## 2012-10-16 LAB — LDL CHOLESTEROL, DIRECT: Direct LDL: 166.6 mg/dL

## 2012-10-17 NOTE — Progress Notes (Signed)
Quick Note:  Pt has CPE on 10/22/12 will go over then. ______

## 2012-10-22 ENCOUNTER — Ambulatory Visit (INDEPENDENT_AMBULATORY_CARE_PROVIDER_SITE_OTHER): Payer: BC Managed Care – PPO | Admitting: Family Medicine

## 2012-10-22 ENCOUNTER — Encounter: Payer: Self-pay | Admitting: Family Medicine

## 2012-10-22 VITALS — BP 110/80 | HR 72 | Temp 98.5°F | Ht 66.75 in | Wt 165.0 lb

## 2012-10-22 DIAGNOSIS — Z0000 Encounter for general adult medical examination without abnormal findings: Secondary | ICD-10-CM

## 2012-10-22 MED ORDER — ATORVASTATIN CALCIUM 20 MG PO TABS: 20.0000 mg | ORAL_TABLET | Freq: Every day | ORAL | Status: DC

## 2012-10-22 MED ORDER — ZOLPIDEM TARTRATE 10 MG PO TABS: 10.0000 mg | ORAL_TABLET | Freq: Every evening | ORAL | Status: DC | PRN

## 2012-10-22 MED ORDER — SERTRALINE HCL 100 MG PO TABS: 150.0000 mg | ORAL_TABLET | Freq: Every day | ORAL | Status: DC

## 2012-10-22 NOTE — Progress Notes (Signed)
  Subjective:    Patient ID: [name], male    DOB: 12/17/1960, 52 y.o.   MRN: [id number]  HPI 52 yr old male for a cpx. He feels fine but asks if we can increase the Zoloft a litle bit. Work has been somewhat stressful, but he sleeps well.    Review of Systems  Constitutional: Negative.   HENT: Negative.   Eyes: Negative.   Respiratory: Negative.   Cardiovascular: Negative.   Gastrointestinal: Negative.   Genitourinary: Negative.   Musculoskeletal: Negative.   Skin: Negative.   Neurological: Negative.   Hematological: Negative.   Psychiatric/Behavioral: Negative for suicidal ideas, hallucinations, behavioral problems, confusion, sleep disturbance, self-injury, dysphoric mood, decreased concentration and agitation. The patient is nervous/anxious. The patient is not hyperactive.        Objective:   Physical Exam  Constitutional: He is oriented to person, place, and time. He appears well-developed and well-nourished. No distress.  HENT:  Head: Normocephalic and atraumatic.  Right Ear: External ear normal.  Left Ear: External ear normal.  Nose: Nose normal.  Mouth/Throat: Oropharynx is clear and moist. No oropharyngeal exudate.  Eyes: Conjunctivae normal and EOM are normal. Pupils are equal, round, and reactive to light. Right eye exhibits no discharge. Left eye exhibits no discharge. No scleral icterus.  Neck: Neck supple. No JVD present. No tracheal deviation present. No thyromegaly present.  Cardiovascular: Normal rate, regular rhythm, normal heart sounds and intact distal pulses.  Exam reveals no gallop and no friction rub.   No murmur heard. Pulmonary/Chest: Effort normal and breath sounds normal. No respiratory distress. He has no wheezes. He has no rales. He exhibits no tenderness.  Abdominal: Soft. Bowel sounds are normal. He exhibits no distension and no mass. There is no tenderness. There is no rebound and no guarding.  Genitourinary: Rectum normal, prostate normal  and penis normal. Guaiac negative stool. No penile tenderness.  Musculoskeletal: Normal range of motion. He exhibits no edema and no tenderness.  Lymphadenopathy:    He has no cervical adenopathy.  Neurological: He is alert and oriented to person, place, and time. He has normal reflexes. No cranial nerve deficit. He exhibits normal muscle tone. Coordination normal.  Skin: Skin is warm and dry. No rash noted. He is not diaphoretic. No erythema. No pallor.  Psychiatric: He has a normal mood and affect. His behavior is normal. Judgment and thought content normal.          Assessment & Plan:  Well exam. Increase the Zoloft to 150 mg a day. Start on Lipitor and recheck labs in 90 days

## 2013-03-22 ENCOUNTER — Encounter (HOSPITAL_COMMUNITY): Payer: Self-pay | Admitting: *Deleted

## 2013-03-22 ENCOUNTER — Emergency Department (HOSPITAL_COMMUNITY)
Admission: EM | Admit: 2013-03-22 | Discharge: 2013-03-22 | Disposition: A | Discharge status: Home / Self Care | Payer: BC Managed Care – PPO | Attending: Emergency Medicine | Admitting: Emergency Medicine

## 2013-03-22 DIAGNOSIS — Y9389 Activity, other specified: Secondary | ICD-10-CM | POA: Insufficient documentation

## 2013-03-22 DIAGNOSIS — Z87442 Personal history of urinary calculi: Secondary | ICD-10-CM | POA: Insufficient documentation

## 2013-03-22 DIAGNOSIS — S058X9A Other injuries of unspecified eye and orbit, initial encounter: Secondary | ICD-10-CM | POA: Insufficient documentation

## 2013-03-22 DIAGNOSIS — IMO0002 Reserved for concepts with insufficient information to code with codable children: Secondary | ICD-10-CM | POA: Insufficient documentation

## 2013-03-22 DIAGNOSIS — H538 Other visual disturbances: Secondary | ICD-10-CM | POA: Insufficient documentation

## 2013-03-22 DIAGNOSIS — I10 Essential (primary) hypertension: Secondary | ICD-10-CM | POA: Insufficient documentation

## 2013-03-22 DIAGNOSIS — Z79899 Other long term (current) drug therapy: Secondary | ICD-10-CM | POA: Insufficient documentation

## 2013-03-22 DIAGNOSIS — S0502XA Injury of conjunctiva and corneal abrasion without foreign body, left eye, initial encounter: Secondary | ICD-10-CM

## 2013-03-22 DIAGNOSIS — Z8669 Personal history of other diseases of the nervous system and sense organs: Secondary | ICD-10-CM | POA: Insufficient documentation

## 2013-03-22 DIAGNOSIS — Y990 Civilian activity done for income or pay: Secondary | ICD-10-CM | POA: Insufficient documentation

## 2013-03-22 DIAGNOSIS — E785 Hyperlipidemia, unspecified: Secondary | ICD-10-CM | POA: Insufficient documentation

## 2013-03-22 DIAGNOSIS — Y92009 Unspecified place in unspecified non-institutional (private) residence as the place of occurrence of the external cause: Secondary | ICD-10-CM | POA: Insufficient documentation

## 2013-03-22 MED ORDER — CIPROFLOXACIN HCL 0.3 % OP SOLN: 1.0000 [drp] | OPHTHALMIC | Status: DC

## 2013-03-22 MED ORDER — TETRACAINE HCL 0.5 % OP SOLN
1.0000 [drp] | Freq: Once | OPHTHALMIC | Status: DC
  Filled 2013-03-22: qty 2

## 2013-03-22 MED ORDER — NAPROXEN 500 MG PO TABS: 500.0000 mg | ORAL_TABLET | Freq: Two times a day (BID) | ORAL | Status: DC

## 2013-03-22 MED ORDER — FLUORESCEIN SODIUM 1 MG OP STRP
1.0000 | ORAL_STRIP | Freq: Once | OPHTHALMIC | Status: DC
  Filled 2013-03-22: qty 1

## 2013-03-22 MED ORDER — OXYCODONE-ACETAMINOPHEN 5-325 MG PO TABS: 1.0000 | ORAL_TABLET | Freq: Four times a day (QID) | ORAL | Status: DC | PRN

## 2013-03-22 NOTE — ED Notes (Signed)
Pt reports working in the yard today, a "limb scratched my eye".  Pt reports pain in L eye with blurred vision.  Redness noted at this time.

## 2013-03-22 NOTE — ED Provider Notes (Signed)
History    This chart was scribed for Glade Nurse, non-physician practitioner working with Raeford Razor, MD by Leone Payor, ED Scribe. This patient was seen in room WTR9/WTR9 and the patient's care was started at 1908.   CSN: [id number]  Arrival date & time 03/22/13  1908   First MD Initiated Contact with Patient 03/22/13 2035      Chief Complaint  Patient presents with  . Eye Injury     The history is provided by the patient. No language interpreter was used.    HPI Comments: [name] is a 52 y.o. male with no pertinent past medical history, who presents to the Emergency Department complaining of sudden onset L eye injury that occurred today about 6 hours ago. States he was working in the yard when he got scratched in the eye with a tree branch. He reports having blurry vision but believes it is due to the watery discharge. Mild, dull pain that is localized. No pain with EOM. Admits eye redness.  UTD with tetanus.    Past Medical History  Diagnosis Date  . Allergy   . Hypertension   . Sleep apnea   . Hyperlipidemia   . History of nephrolithiasis     Past Surgical History  Procedure Laterality Date  . Appendectomy    . Uvulectomy    . Lumbar laminectomy      Family History  Problem Relation Age of Onset  . Emphysema Mother   . Asthma Mother   . Heart disease Father   . Hyperlipidemia      family  hx  . Hypertension      family hx  . Stroke      family hx  . Sudden death      family hx    History  Substance Use Topics  . Smoking status: Never Smoker   . Smokeless tobacco: Never Used  . Alcohol Use: Yes     Comment: rare      Review of Systems  Constitutional: Negative for fever and diaphoresis.  HENT: Negative for neck pain and neck stiffness.   Eyes: Positive for pain, discharge, redness and visual disturbance. Negative for photophobia.       Left eye: discharge, pain, redness, blurry s/p trauma  Respiratory: Negative for apnea, chest  tightness and shortness of breath.   Cardiovascular: Negative for chest pain and palpitations.  Gastrointestinal: Negative for nausea, vomiting, diarrhea and constipation.  Genitourinary: Negative for dysuria.  Musculoskeletal: Negative for gait problem.  Skin: Negative for rash.  Neurological: Negative for dizziness, weakness, light-headedness, numbness and headaches.    Allergies  Zetia  Home Medications   Current Outpatient Rx  Name  Route  Sig  Dispense  Refill  . atorvastatin (LIPITOR) 20 MG tablet   Oral   Take 1 tablet (20 mg total) by mouth daily.   30 tablet   11   . sertraline (ZOLOFT) 100 MG tablet   Oral   Take 1.5 tablets (150 mg total) by mouth daily.   45 tablet   11     Pt needs office visit for future refills   . traZODone (DESYREL) 50 MG tablet      TAKE 1 TABLET AT BEDTIME   30 tablet   10   . zolpidem (AMBIEN) 10 MG tablet   Oral   Take 1 tablet (10 mg total) by mouth at bedtime as needed for sleep.   30 tablet   5  Pt needs office visit for future refills.     BP 113/71  Pulse 67  Temp(Src) 98.6 F (37 C)  SpO2 98%  Physical Exam  Nursing note and vitals reviewed. Constitutional: He is oriented to person, place, and time. He appears well-developed and well-nourished. No distress.  HENT:  Head: Normocephalic and atraumatic.  Eyes: Conjunctivae, EOM and lids are normal. Pupils are equal, round, and reactive to light. No foreign bodies found. Left eye exhibits no discharge and no exudate. No foreign body present in the left eye.  Slit lamp exam:      The left eye shows corneal abrasion.    Has a corneal abrasion at 8 o'clock from the pupil.  Neck: Normal range of motion. Neck supple.  No meningeal signs  Cardiovascular: Normal rate, regular rhythm and normal heart sounds.  Exam reveals no gallop and no friction rub.   No murmur heard. Pulmonary/Chest: Effort normal and breath sounds normal. No respiratory distress. He has no  wheezes. He has no rales. He exhibits no tenderness.  Abdominal: Soft. Bowel sounds are normal. He exhibits no distension. There is no tenderness. There is no rebound and no guarding.  Musculoskeletal: Normal range of motion. He exhibits no edema and no tenderness.  Neurological: He is alert and oriented to person, place, and time. No cranial nerve deficit.  Skin: Skin is warm and dry. He is not diaphoretic. No erythema.    ED Course  Procedures (including critical care time)  DIAGNOSTIC STUDIES: Oxygen Saturation is 98% on RA, normal by my interpretation.    COORDINATION OF CARE: 9:11 PM Discussed treatment plan with pt at bedside and pt agreed to plan.   Labs Reviewed - No data to display No results found.  Medications - No data to display  1. Corneal abrasion, left, initial encounter    Discharge Medication List as of 03/22/2013  9:33 PM    START taking these medications   Details  ciprofloxacin (CILOXAN) 0.3 % ophthalmic solution Apply 1 drop to eye every 4 (four) hours. Place one drop in effected eye every 4 hours until follow up with opthalmologist, Starting 03/22/2013, Until Discontinued, Print    naproxen (NAPROSYN) 500 MG tablet Take 1 tablet (500 mg total) by mouth 2 (two) times daily with a meal., Starting 03/22/2013, Until Discontinued, Print    oxyCODONE-acetaminophen (PERCOCET) 5-325 MG per tablet Take 1 tablet by mouth every 6 (six) hours as needed for pain., Starting 03/22/2013, Until Discontinued, Print          MDM  Pt with corneal abrasion of left eye on PE. Eye irrigated w NS, no evidence of FB.  Visual acuity R Distance: 20/25 ; L Distance: 20/40.  Pt is not a contact lens wearer.  Exam non-concerning for orbital cellulitis, hyphema, corneal ulcers. Patient will be discharged home with naprosyn, pain meds, and ciloxan with optho follow up.   Patient understands to follow up with ophthalmology, & to return to ER if new symptoms develop including change in  vision, purulent drainage, or entrapment.  I personally performed the services described in this documentation, which was scribed in my presence. The recorded information has been reviewed and is accurate.      Glade Nurse, PA-C 03/23/13 0120

## 2013-03-29 NOTE — ED Provider Notes (Signed)
Medical screening examination/treatment/procedure(s) were performed by non-physician practitioner and as supervising physician I was immediately available for consultation/collaboration.  Raeford Razor, MD 03/29/13 [phone]

## 2013-04-23 ENCOUNTER — Other Ambulatory Visit: Payer: Self-pay | Admitting: Family Medicine

## 2013-04-24 NOTE — Telephone Encounter (Signed)
Refill Zolpidem for 6 months and refill Trazadone for one year

## 2013-06-03 ENCOUNTER — Other Ambulatory Visit: Payer: Self-pay | Admitting: Family Medicine

## 2013-10-26 ENCOUNTER — Other Ambulatory Visit: Payer: Self-pay | Admitting: Family Medicine

## 2013-10-28 NOTE — Telephone Encounter (Signed)
Call in #30 with 5 rf 

## 2013-10-31 ENCOUNTER — Other Ambulatory Visit: Payer: Self-pay | Admitting: Family Medicine

## 2013-11-01 ENCOUNTER — Other Ambulatory Visit: Payer: Self-pay | Admitting: Family Medicine

## 2013-11-03 NOTE — Telephone Encounter (Signed)
Refill both for 90 days only. He needs an OV soon

## 2013-11-19 ENCOUNTER — Other Ambulatory Visit: Payer: BC Managed Care – PPO

## 2013-11-26 ENCOUNTER — Encounter: Payer: BC Managed Care – PPO | Admitting: Family Medicine

## 2013-11-26 DIAGNOSIS — Z0289 Encounter for other administrative examinations: Secondary | ICD-10-CM

## 2014-06-29 ENCOUNTER — Other Ambulatory Visit: Payer: Self-pay | Admitting: Family Medicine

## 2014-06-29 NOTE — Telephone Encounter (Signed)
Call in #30 only, he needs an OV soon  

## 2014-06-30 ENCOUNTER — Telehealth: Payer: Self-pay | Admitting: Family Medicine

## 2014-06-30 NOTE — Telephone Encounter (Signed)
Please make an OV for him

## 2014-06-30 NOTE — Telephone Encounter (Signed)
Pt has a large cyst on his side that he would like addressed this week if possible. (fatty tumor)

## 2014-07-01 NOTE — Telephone Encounter (Signed)
Lm on vm to resc

## 2014-07-01 NOTE — Telephone Encounter (Signed)
appt scheduled

## 2014-07-03 ENCOUNTER — Encounter: Payer: Self-pay | Admitting: Family Medicine

## 2014-07-03 ENCOUNTER — Ambulatory Visit (INDEPENDENT_AMBULATORY_CARE_PROVIDER_SITE_OTHER): Payer: BC Managed Care – PPO | Admitting: Family Medicine

## 2014-07-03 VITALS — BP 114/74 | HR 65 | Temp 97.9°F | Ht 66.75 in | Wt 176.0 lb

## 2014-07-03 DIAGNOSIS — D179 Benign lipomatous neoplasm, unspecified: Secondary | ICD-10-CM

## 2014-07-03 MED ORDER — TRAZODONE HCL 50 MG PO TABS: ORAL_TABLET | ORAL | Status: DC

## 2014-07-03 MED ORDER — ZOLPIDEM TARTRATE 10 MG PO TABS: ORAL_TABLET | ORAL | Status: DC

## 2014-07-03 MED ORDER — SERTRALINE HCL 100 MG PO TABS: ORAL_TABLET | ORAL | Status: DC

## 2014-07-03 NOTE — Progress Notes (Signed)
Pre visit review using our clinic review tool, if applicable. No additional management support is needed unless otherwise documented below in the visit note. 

## 2014-07-03 NOTE — Progress Notes (Signed)
   Subjective:    Patient ID: [name], male    DOB: 05/31/1961, 53 y.o.   MRN: [id number]  HPI Here to check 2 masses that are steadily growing larger and which are painful.    Review of Systems  Constitutional: Negative.        Objective:   Physical Exam  Constitutional: He appears well-developed and well-nourished.  Skin:  He has a medium sized firm mobile tender mass over the left shoulder and a larger mobile firm tender mass on the left flank           Assessment & Plan:  These are both lipomas and I recommend they be removed. We will refer him to Surgery.

## 2014-07-20 ENCOUNTER — Other Ambulatory Visit: Payer: BC Managed Care – PPO

## 2014-07-24 ENCOUNTER — Other Ambulatory Visit (INDEPENDENT_AMBULATORY_CARE_PROVIDER_SITE_OTHER): Payer: Self-pay | Admitting: General Surgery

## 2014-07-27 ENCOUNTER — Encounter: Payer: BC Managed Care – PPO | Admitting: Family Medicine

## 2014-07-27 ENCOUNTER — Telehealth: Payer: Self-pay | Admitting: Family Medicine

## 2014-07-27 DIAGNOSIS — Z0289 Encounter for other administrative examinations: Secondary | ICD-10-CM

## 2014-07-27 NOTE — Telephone Encounter (Signed)
I left a voice message for pt and advised him to reschedule, he was no the schedule for today to see Dr. Sarajane Jews for a CPE.

## 2014-07-28 ENCOUNTER — Other Ambulatory Visit: Payer: Self-pay | Admitting: Family Medicine

## 2015-04-12 ENCOUNTER — Encounter: Payer: Self-pay | Admitting: Family Medicine

## 2015-04-12 ENCOUNTER — Telehealth: Payer: Self-pay | Admitting: Family Medicine

## 2015-04-12 ENCOUNTER — Ambulatory Visit (INDEPENDENT_AMBULATORY_CARE_PROVIDER_SITE_OTHER): Payer: BLUE CROSS/BLUE SHIELD | Admitting: Family Medicine

## 2015-04-12 VITALS — BP 100/60 | HR 72 | Temp 98.6°F | Ht 66.75 in | Wt 174.0 lb

## 2015-04-12 DIAGNOSIS — J018 Other acute sinusitis: Secondary | ICD-10-CM

## 2015-04-12 MED ORDER — AZITHROMYCIN 250 MG PO TABS: ORAL_TABLET | ORAL | Status: DC

## 2015-04-12 MED ORDER — ZOLPIDEM TARTRATE 10 MG PO TABS: ORAL_TABLET | ORAL | Status: DC

## 2015-04-12 NOTE — Progress Notes (Signed)
Pre visit review using our clinic review tool, if applicable. No additional management support is needed unless otherwise documented below in the visit note. 

## 2015-04-12 NOTE — Telephone Encounter (Signed)
I called pharmacy, they did not get the script e-scribe, I gave a verbal order.

## 2015-04-12 NOTE — Telephone Encounter (Signed)
Pt's rx azithromycin (ZITHROMAX) 250 MG tablet is not at [facility] [address]  Can you please resend? They went to pu and not there, would like asap thanks

## 2015-04-12 NOTE — Progress Notes (Signed)
   Subjective:    Patient ID: [name], male    DOB: March 18, 1961, 54 y.o.   MRN: [id number]  HPI Here for 4 days of sinus congestion and blowing green mucus from the nose. No fever. He saw an [facility] yesterday and was given some cough syrup. His wife had similar sx and she improved after taking a Zpack.    Review of Systems  Constitutional: Negative.   HENT: Positive for congestion, postnasal drip and sinus pressure.   Eyes: Negative.   Respiratory: Positive for cough.        Objective:   Physical Exam  Constitutional: He appears well-developed and well-nourished.  HENT:  Right Ear: External ear normal.  Left Ear: External ear normal.  Nose: Nose normal.  Mouth/Throat: Oropharynx is clear and moist.  Eyes: Conjunctivae are normal.  Pulmonary/Chest: Effort normal and breath sounds normal.  Lymphadenopathy:    He has no cervical adenopathy.          Assessment & Plan:  Sinusitis. Treat with a Zpack.

## 2015-04-21 ENCOUNTER — Ambulatory Visit: Payer: BLUE CROSS/BLUE SHIELD | Admitting: Family Medicine

## 2015-05-17 ENCOUNTER — Telehealth: Payer: Self-pay | Admitting: Family Medicine

## 2015-05-17 NOTE — Telephone Encounter (Signed)
Lm on vm for appt at 4pm Tuesday.

## 2015-05-17 NOTE — Telephone Encounter (Addendum)
Pt seen 7/11 for sore throat, cough.  Pt states he has still had that same cough ever since then,. Pt ouls like to see dr fry tomorrow Only "Same day" appt tomorrow. Is it ok to use SD ?

## 2015-05-17 NOTE — Telephone Encounter (Signed)
Okay to schedule

## 2015-05-18 ENCOUNTER — Ambulatory Visit (INDEPENDENT_AMBULATORY_CARE_PROVIDER_SITE_OTHER): Payer: BLUE CROSS/BLUE SHIELD | Admitting: Family Medicine

## 2015-05-18 ENCOUNTER — Encounter: Payer: Self-pay | Admitting: Family Medicine

## 2015-05-18 VITALS — BP 113/64 | HR 75 | Temp 98.5°F | Ht 66.75 in | Wt 175.0 lb

## 2015-05-18 DIAGNOSIS — J209 Acute bronchitis, unspecified: Secondary | ICD-10-CM

## 2015-05-18 MED ORDER — AMOXICILLIN-POT CLAVULANATE 875-125 MG PO TABS: 1.0000 | ORAL_TABLET | Freq: Two times a day (BID) | ORAL | Status: DC

## 2015-05-18 NOTE — Progress Notes (Signed)
Pre visit review using our clinic review tool, if applicable. No additional management support is needed unless otherwise documented below in the visit note. 

## 2015-05-18 NOTE — Progress Notes (Signed)
   Subjective:    Patient ID: [name], male    DOB: 1960-12-27, 54 y.o.   MRN: [id number]  HPI Here for one week of chest tightness and coughing up green sputum. He was treated for a sinusitis in July with a Zpack and this resolved.    Review of Systems  Constitutional: Negative.   HENT: Positive for congestion and postnasal drip. Negative for sinus pressure.   Eyes: Negative.   Respiratory: Positive for cough and chest tightness. Negative for shortness of breath and wheezing.   Cardiovascular: Negative.        Objective:   Physical Exam  Constitutional: He appears well-developed and well-nourished.  HENT:  Right Ear: External ear normal.  Left Ear: External ear normal.  Nose: Nose normal.  Mouth/Throat: Oropharynx is clear and moist.  Eyes: Conjunctivae are normal.  Neck: No thyromegaly present.  Cardiovascular: Normal rate, regular rhythm, normal heart sounds and intact distal pulses.   Pulmonary/Chest: Effort normal. No respiratory distress. He has no wheezes. He has no rales.  Scattered rhonchi   Lymphadenopathy:    He has no cervical adenopathy.          Assessment & Plan:  Bronchitis, treat with Augmentin.

## 2015-06-09 ENCOUNTER — Other Ambulatory Visit: Payer: Self-pay | Admitting: Family Medicine

## 2015-08-01 ENCOUNTER — Other Ambulatory Visit: Payer: Self-pay | Admitting: Family Medicine

## 2015-09-14 ENCOUNTER — Other Ambulatory Visit: Payer: Self-pay | Admitting: Family Medicine

## 2015-09-14 NOTE — Telephone Encounter (Signed)
Refill request for Trazodone 5 mg, [facility]

## 2015-09-15 MED ORDER — TRAZODONE HCL 50 MG PO TABS: 50.0000 mg | ORAL_TABLET | Freq: Every day | ORAL | Status: DC

## 2015-09-15 NOTE — Telephone Encounter (Signed)
Per Dr. Sarajane Jews okay to change to the 90 day supply and I did send new script e-scribe.

## 2015-10-10 ENCOUNTER — Other Ambulatory Visit: Payer: Self-pay | Admitting: Family Medicine

## 2015-10-11 NOTE — Telephone Encounter (Signed)
Call in #30 with 5 rf 

## 2015-11-16 ENCOUNTER — Other Ambulatory Visit: Payer: Self-pay | Admitting: Family Medicine

## 2015-11-16 NOTE — Telephone Encounter (Signed)
Call in #30 with 5 rf for both meds

## 2015-11-16 NOTE — Telephone Encounter (Signed)
Ok to refill 

## 2015-12-08 ENCOUNTER — Encounter: Payer: Self-pay | Admitting: Family Medicine

## 2015-12-08 ENCOUNTER — Ambulatory Visit (INDEPENDENT_AMBULATORY_CARE_PROVIDER_SITE_OTHER)
Admission: RE | Admit: 2015-12-08 | Discharge: 2015-12-08 | Disposition: A | Discharge status: Home / Self Care | Payer: BLUE CROSS/BLUE SHIELD | Source: Ambulatory Visit | Attending: Family Medicine | Admitting: Family Medicine

## 2015-12-08 ENCOUNTER — Ambulatory Visit (INDEPENDENT_AMBULATORY_CARE_PROVIDER_SITE_OTHER): Payer: BLUE CROSS/BLUE SHIELD | Admitting: Family Medicine

## 2015-12-08 VITALS — BP 118/68 | HR 110 | Temp 98.0°F | Ht 66.75 in | Wt 181.0 lb

## 2015-12-08 DIAGNOSIS — S20222A Contusion of left back wall of thorax, initial encounter: Secondary | ICD-10-CM

## 2015-12-08 MED ORDER — HYDROCODONE-ACETAMINOPHEN 10-325 MG PO TABS
1.0000 | ORAL_TABLET | Freq: Three times a day (TID) | ORAL | Status: DC | PRN
Start: 2015-12-08 — End: 2015-12-10

## 2015-12-08 NOTE — Progress Notes (Signed)
   Subjective:    Patient ID: [name], male    DOB: 03-Apr-1961, 55 y.o.   MRN: [id number]  HPI Here to check some painful swelling he has in the left buttock. About 2 weeks ago while at work he slipped and fell off the back of his truck and struck his lower back against the tailgate. He was stiff and sore for the first week but managed well with ice and Aleve, and he has continued to work every day. His job is physical since he works for a [employer]. Then several days ago he had the rapid onset of more swelling and severe pain in the left buttock. No pain or numbness in the legs.    Review of Systems  Constitutional: Negative.   Musculoskeletal: Positive for back pain and gait problem.       Objective:   Physical Exam  Constitutional:  In pain, walks with a limp   Musculoskeletal:  He has a large tender hematoma over the left buttock. He is also tender over the left edge of the sacrum          Assessment & Plan:  He has a hematoma in the buttock from a recent fall. We will get Xrays of the pelvis and LS spine to rule out fractures. He needs to rest and I suggested he take a few days off work. Use ice packs. Use Vicodin for pain.

## 2015-12-08 NOTE — Progress Notes (Signed)
Pre visit review using our clinic review tool, if applicable. No additional management support is needed unless otherwise documented below in the visit note. 

## 2015-12-10 ENCOUNTER — Telehealth: Payer: Self-pay | Admitting: Family Medicine

## 2015-12-10 MED ORDER — INDOMETHACIN 50 MG PO CAPS: 50.0000 mg | ORAL_CAPSULE | Freq: Three times a day (TID) | ORAL | Status: DC | PRN

## 2015-12-10 MED ORDER — PROMETHAZINE HCL 25 MG PO TABS: 25.0000 mg | ORAL_TABLET | ORAL | Status: DC | PRN

## 2015-12-10 NOTE — Telephone Encounter (Signed)
I left a voice message for pt with below information, added hydrocodone to drug allergy list, [facility], also advised pt to return my call.

## 2015-12-10 NOTE — Telephone Encounter (Signed)
Butch Penny shelton called for pt. She states the hydrocodone dr fry gave him has caused him to itch and scratching very badly. Pt would like to know if you can call in something else for pain.  Pt is vomiting as well, and they say unrelated to the hydrocodone. Pt is requesting phenergan as well.  [facility] [address]

## 2015-12-10 NOTE — Telephone Encounter (Signed)
Stop the Norco. Call in Indomethacin 50 mg TID prn pain instead, #60 with 2 rf. Also call in Phenergan 25 mg to take every 4 hours prn nausea, #60 with 2 rf

## 2015-12-10 NOTE — Telephone Encounter (Signed)
I left another voice message with below information. 

## 2015-12-13 ENCOUNTER — Encounter (HOSPITAL_COMMUNITY): Admission: EM | Disposition: A | Discharge status: Home Health | Payer: Self-pay | Source: Home / Self Care

## 2015-12-13 ENCOUNTER — Inpatient Hospital Stay (HOSPITAL_COMMUNITY): Payer: BLUE CROSS/BLUE SHIELD | Admitting: Anesthesiology

## 2015-12-13 ENCOUNTER — Encounter (HOSPITAL_COMMUNITY): Payer: Self-pay | Admitting: Emergency Medicine

## 2015-12-13 ENCOUNTER — Inpatient Hospital Stay (HOSPITAL_COMMUNITY)
Admission: EM | Admit: 2015-12-13 | Discharge: 2015-12-15 | DRG: 580 | Disposition: A | Discharge status: Home Health | Payer: BLUE CROSS/BLUE SHIELD | Attending: General Surgery | Admitting: General Surgery

## 2015-12-13 ENCOUNTER — Emergency Department (HOSPITAL_COMMUNITY): Payer: BLUE CROSS/BLUE SHIELD

## 2015-12-13 DIAGNOSIS — Z825 Family history of asthma and other chronic lower respiratory diseases: Secondary | ICD-10-CM

## 2015-12-13 DIAGNOSIS — Z8249 Family history of ischemic heart disease and other diseases of the circulatory system: Secondary | ICD-10-CM | POA: Diagnosis not present

## 2015-12-13 DIAGNOSIS — Z888 Allergy status to other drugs, medicaments and biological substances status: Secondary | ICD-10-CM

## 2015-12-13 DIAGNOSIS — G4730 Sleep apnea, unspecified: Secondary | ICD-10-CM | POA: Diagnosis present

## 2015-12-13 DIAGNOSIS — L0390 Cellulitis, unspecified: Secondary | ICD-10-CM | POA: Diagnosis present

## 2015-12-13 DIAGNOSIS — E785 Hyperlipidemia, unspecified: Secondary | ICD-10-CM | POA: Diagnosis present

## 2015-12-13 DIAGNOSIS — S300XXA Contusion of lower back and pelvis, initial encounter: Principal | ICD-10-CM | POA: Diagnosis present

## 2015-12-13 DIAGNOSIS — L02416 Cutaneous abscess of left lower limb: Secondary | ICD-10-CM | POA: Diagnosis present

## 2015-12-13 DIAGNOSIS — Z885 Allergy status to narcotic agent status: Secondary | ICD-10-CM

## 2015-12-13 DIAGNOSIS — Z823 Family history of stroke: Secondary | ICD-10-CM

## 2015-12-13 DIAGNOSIS — W1789XA Other fall from one level to another, initial encounter: Secondary | ICD-10-CM | POA: Diagnosis present

## 2015-12-13 DIAGNOSIS — Z87442 Personal history of urinary calculi: Secondary | ICD-10-CM | POA: Diagnosis not present

## 2015-12-13 DIAGNOSIS — F419 Anxiety disorder, unspecified: Secondary | ICD-10-CM | POA: Diagnosis present

## 2015-12-13 DIAGNOSIS — L0291 Cutaneous abscess, unspecified: Secondary | ICD-10-CM

## 2015-12-13 DIAGNOSIS — Z79899 Other long term (current) drug therapy: Secondary | ICD-10-CM

## 2015-12-13 HISTORY — PX: IRRIGATION AND DEBRIDEMENT ABSCESS: SHX5252

## 2015-12-13 LAB — CBC WITH DIFFERENTIAL/PLATELET
BASOS PCT: 0 %
Basophils Absolute: 0 10*3/uL (ref 0.0–0.1)
Eosinophils Absolute: 0.2 10*3/uL (ref 0.0–0.7)
Eosinophils Relative: 1 %
HEMATOCRIT: 41.8 % (ref 39.0–52.0)
HEMOGLOBIN: 14.1 g/dL (ref 13.0–17.0)
LYMPHS PCT: 9 %
Lymphs Abs: 2 10*3/uL (ref 0.7–4.0)
MCH: 30.2 pg (ref 26.0–34.0)
MCHC: 33.7 g/dL (ref 30.0–36.0)
MCV: 89.5 fL (ref 78.0–100.0)
MONOS PCT: 8 %
Monocytes Absolute: 1.8 10*3/uL — ABNORMAL HIGH (ref 0.1–1.0)
NEUTROS ABS: 18.6 10*3/uL — AB (ref 1.7–7.7)
Neutrophils Relative %: 82 %
Platelets: 264 10*3/uL (ref 150–400)
RBC: 4.67 MIL/uL (ref 4.22–5.81)
RDW: 15.3 % (ref 11.5–15.5)
WBC Morphology: INCREASED
WBC: 22.6 10*3/uL — ABNORMAL HIGH (ref 4.0–10.5)

## 2015-12-13 LAB — COMPREHENSIVE METABOLIC PANEL
ALBUMIN: 2.9 g/dL — AB (ref 3.5–5.0)
ALK PHOS: 76 U/L (ref 38–126)
ALT: 55 U/L (ref 17–63)
ANION GAP: 9 (ref 5–15)
AST: 51 U/L — AB (ref 15–41)
BUN: 17 mg/dL (ref 6–20)
CO2: 22 mmol/L (ref 22–32)
Calcium: 8.3 mg/dL — ABNORMAL LOW (ref 8.9–10.3)
Chloride: 101 mmol/L (ref 101–111)
Creatinine, Ser: 0.88 mg/dL (ref 0.61–1.24)
GFR calc Af Amer: >60 mL/min (ref >60)
GFR calc non Af Amer: >60 mL/min (ref >60)
GLUCOSE: 104 mg/dL — AB (ref 65–99)
POTASSIUM: 4.5 mmol/L (ref 3.5–5.1)
SODIUM: 132 mmol/L — AB (ref 135–145)
Total Bilirubin: 1.9 mg/dL — ABNORMAL HIGH (ref 0.3–1.2)
Total Protein: 5.9 g/dL — ABNORMAL LOW (ref 6.5–8.1)

## 2015-12-13 LAB — PROTIME-INR
INR: 1.24 (ref 0.00–1.49)
Prothrombin Time: 15.3 seconds — ABNORMAL HIGH (ref 11.6–15.2)

## 2015-12-13 LAB — I-STAT CG4 LACTIC ACID, ED: Lactic Acid, Venous: 1.44 mmol/L (ref 0.5–2.0)

## 2015-12-13 SURGERY — IRRIGATION AND DEBRIDEMENT ABSCESS
Anesthesia: General | Site: Buttocks | Laterality: Left

## 2015-12-13 MED ORDER — ONDANSETRON HCL 4 MG/2ML IJ SOLN: 4.0000 mg | Freq: Four times a day (QID) | INTRAMUSCULAR | Status: DC | PRN

## 2015-12-13 MED ORDER — LIDOCAINE HCL (CARDIAC) 20 MG/ML IV SOLN
INTRAVENOUS | Status: AC
Start: 2015-12-13 — End: 2015-12-13
  Filled 2015-12-13: qty 5

## 2015-12-13 MED ORDER — ZOLPIDEM TARTRATE 5 MG PO TABS: 5.0000 mg | ORAL_TABLET | Freq: Every evening | ORAL | Status: DC | PRN

## 2015-12-13 MED ORDER — ONDANSETRON HCL 4 MG/2ML IJ SOLN
4.0000 mg | Freq: Once | INTRAMUSCULAR | Status: AC
  Administered 2015-12-13: 4 mg via INTRAVENOUS
  Filled 2015-12-13: qty 2

## 2015-12-13 MED ORDER — ONDANSETRON HCL 4 MG/2ML IJ SOLN
INTRAMUSCULAR | Status: DC | PRN
Start: 2015-12-13 — End: 2015-12-13
  Administered 2015-12-13: 4 mg via INTRAVENOUS

## 2015-12-13 MED ORDER — MIDAZOLAM HCL 5 MG/5ML IJ SOLN
INTRAMUSCULAR | Status: DC | PRN
  Administered 2015-12-13: 2 mg via INTRAVENOUS

## 2015-12-13 MED ORDER — LIDOCAINE HCL (CARDIAC) 20 MG/ML IV SOLN
INTRAVENOUS | Status: DC | PRN
  Administered 2015-12-13: 100 mg via INTRAVENOUS

## 2015-12-13 MED ORDER — LACTATED RINGERS IV SOLN
INTRAVENOUS | Status: DC
  Administered 2015-12-13: 17:00:00 via INTRAVENOUS

## 2015-12-13 MED ORDER — VANCOMYCIN HCL 10 G IV SOLR
1500.0000 mg | INTRAVENOUS | Status: AC
  Administered 2015-12-13: 1500 mg via INTRAVENOUS
  Filled 2015-12-13: qty 1500

## 2015-12-13 MED ORDER — ONDANSETRON 4 MG PO TBDP: 4.0000 mg | ORAL_TABLET | Freq: Four times a day (QID) | ORAL | Status: DC | PRN

## 2015-12-13 MED ORDER — HYDROMORPHONE HCL 2 MG/ML IJ SOLN
INTRAMUSCULAR | Status: AC
  Filled 2015-12-13: qty 1

## 2015-12-13 MED ORDER — DIPHENHYDRAMINE HCL 50 MG/ML IJ SOLN: 25.0000 mg | Freq: Four times a day (QID) | INTRAMUSCULAR | Status: DC | PRN

## 2015-12-13 MED ORDER — POTASSIUM CHLORIDE IN NACL 20-0.9 MEQ/L-% IV SOLN
INTRAVENOUS | Status: DC
  Administered 2015-12-13: 14:00:00 via INTRAVENOUS
  Filled 2015-12-13: qty 1000

## 2015-12-13 MED ORDER — PROPOFOL 10 MG/ML IV BOLUS
INTRAVENOUS | Status: DC | PRN
  Administered 2015-12-13: 200 mg via INTRAVENOUS

## 2015-12-13 MED ORDER — BUPIVACAINE-EPINEPHRINE (PF) 0.25% -1:200000 IJ SOLN
INTRAMUSCULAR | Status: AC
  Filled 2015-12-13: qty 30

## 2015-12-13 MED ORDER — FENTANYL CITRATE (PF) 100 MCG/2ML IJ SOLN
INTRAMUSCULAR | Status: DC | PRN
  Administered 2015-12-13 (×2): 50 ug via INTRAVENOUS

## 2015-12-13 MED ORDER — SUCCINYLCHOLINE CHLORIDE 20 MG/ML IJ SOLN
INTRAMUSCULAR | Status: DC | PRN
  Administered 2015-12-13: 100 mg via INTRAVENOUS

## 2015-12-13 MED ORDER — SERTRALINE HCL 100 MG PO TABS
100.0000 mg | ORAL_TABLET | Freq: Every day | ORAL | Status: DC
  Administered 2015-12-14 – 2015-12-15 (×2): 100 mg via ORAL
  Filled 2015-12-13 (×2): qty 1

## 2015-12-13 MED ORDER — PIPERACILLIN-TAZOBACTAM 3.375 G IVPB 30 MIN
3.3750 g | INTRAVENOUS | Status: AC
  Administered 2015-12-13: 3.375 g via INTRAVENOUS
  Filled 2015-12-13: qty 50

## 2015-12-13 MED ORDER — ONDANSETRON HCL 4 MG/2ML IJ SOLN: 4.0000 mg | Freq: Once | INTRAMUSCULAR | Status: DC | PRN

## 2015-12-13 MED ORDER — MORPHINE SULFATE (PF) 4 MG/ML IV SOLN
4.0000 mg | Freq: Once | INTRAVENOUS | Status: AC
  Administered 2015-12-13: 4 mg via INTRAVENOUS
  Filled 2015-12-13: qty 1

## 2015-12-13 MED ORDER — LACTATED RINGERS IV SOLN
INTRAVENOUS | Status: DC | PRN
Start: 2015-12-13 — End: 2015-12-13
  Administered 2015-12-13: 15:00:00 via INTRAVENOUS

## 2015-12-13 MED ORDER — HYDROMORPHONE HCL 1 MG/ML IJ SOLN
INTRAMUSCULAR | Status: DC | PRN
  Administered 2015-12-13 (×2): 0.5 mg via INTRAVENOUS
  Administered 2015-12-13: 1 mg via INTRAVENOUS

## 2015-12-13 MED ORDER — SODIUM CHLORIDE 0.9 % IV SOLN
Freq: Once | INTRAVENOUS | Status: AC
  Administered 2015-12-13: 13:00:00 via INTRAVENOUS

## 2015-12-13 MED ORDER — FENTANYL CITRATE (PF) 100 MCG/2ML IJ SOLN
INTRAMUSCULAR | Status: AC
  Filled 2015-12-13: qty 2

## 2015-12-13 MED ORDER — ONDANSETRON HCL 4 MG/2ML IJ SOLN
INTRAMUSCULAR | Status: AC
  Filled 2015-12-13: qty 2

## 2015-12-13 MED ORDER — HYDROMORPHONE HCL 1 MG/ML IJ SOLN: 0.2500 mg | INTRAMUSCULAR | Status: DC | PRN

## 2015-12-13 MED ORDER — LIDOCAINE HCL (CARDIAC) 20 MG/ML IV SOLN
INTRAVENOUS | Status: AC
  Filled 2015-12-13: qty 5

## 2015-12-13 MED ORDER — PIPERACILLIN-TAZOBACTAM 3.375 G IVPB
3.3750 g | Freq: Three times a day (TID) | INTRAVENOUS | Status: DC
  Administered 2015-12-13 – 2015-12-15 (×5): 3.375 g via INTRAVENOUS
  Filled 2015-12-13 (×7): qty 50

## 2015-12-13 MED ORDER — MORPHINE SULFATE (PF) 2 MG/ML IV SOLN
2.0000 mg | INTRAVENOUS | Status: DC | PRN
  Administered 2015-12-14 (×3): 2 mg via INTRAVENOUS
  Filled 2015-12-13 (×4): qty 1

## 2015-12-13 MED ORDER — MIDAZOLAM HCL 2 MG/2ML IJ SOLN
INTRAMUSCULAR | Status: AC
  Filled 2015-12-13: qty 2

## 2015-12-13 MED ORDER — DIPHENHYDRAMINE HCL 25 MG PO CAPS: 25.0000 mg | ORAL_CAPSULE | Freq: Four times a day (QID) | ORAL | Status: DC | PRN

## 2015-12-13 MED ORDER — VANCOMYCIN HCL 10 G IV SOLR
1500.0000 mg | Freq: Two times a day (BID) | INTRAVENOUS | Status: DC
  Administered 2015-12-14 – 2015-12-15 (×3): 1500 mg via INTRAVENOUS
  Filled 2015-12-13 (×4): qty 1500

## 2015-12-13 MED ORDER — IOHEXOL 300 MG/ML  SOLN
100.0000 mL | Freq: Once | INTRAMUSCULAR | Status: AC | PRN
  Administered 2015-12-13: 100 mL via INTRAVENOUS

## 2015-12-13 MED ORDER — PROPOFOL 10 MG/ML IV BOLUS
INTRAVENOUS | Status: AC
  Filled 2015-12-13: qty 20

## 2015-12-13 MED ORDER — PANTOPRAZOLE SODIUM 40 MG IV SOLR
40.0000 mg | Freq: Every day | INTRAVENOUS | Status: DC
  Administered 2015-12-13: 40 mg via INTRAVENOUS
  Filled 2015-12-13 (×2): qty 40

## 2015-12-13 SURGICAL SUPPLY — 32 items
BLADE HEX COATED 2.75 (ELECTRODE) ×1 IMPLANT
BLADE SURG SZ10 CARB STEEL (BLADE) ×2 IMPLANT
BNDG GAUZE ELAST 4 BULKY (GAUZE/BANDAGES/DRESSINGS) ×1 IMPLANT
COVER SURGICAL LIGHT HANDLE (MISCELLANEOUS) ×2 IMPLANT
DECANTER SPIKE VIAL GLASS SM (MISCELLANEOUS) IMPLANT
DRAPE LAPAROTOMY TRNSV 102X78 (DRAPE) IMPLANT
ELECT PENCIL ROCKER SW 15FT (MISCELLANEOUS) ×2 IMPLANT
ELECT REM PT RETURN 9FT ADLT (ELECTROSURGICAL) ×2
ELECTRODE REM PT RTRN 9FT ADLT (ELECTROSURGICAL) ×1 IMPLANT
GAUZE SPONGE 4X4 12PLY STRL (GAUZE/BANDAGES/DRESSINGS) ×2 IMPLANT
GLOVE BIO SURGEON STRL SZ 6.5 (GLOVE) ×1 IMPLANT
GLOVE BIOGEL PI IND STRL 6.5 (GLOVE) IMPLANT
GLOVE BIOGEL PI IND STRL 7.0 (GLOVE) ×1 IMPLANT
GLOVE BIOGEL PI INDICATOR 6.5 (GLOVE) ×1
GLOVE BIOGEL PI INDICATOR 7.0 (GLOVE) ×1
GOWN STRL REIN 3XL XLG LVL4 (GOWN DISPOSABLE) ×1 IMPLANT
GOWN STRL REUS W/TWL LRG LVL3 (GOWN DISPOSABLE) ×2 IMPLANT
KIT BASIN OR (CUSTOM PROCEDURE TRAY) ×2 IMPLANT
LIQUID BAND (GAUZE/BANDAGES/DRESSINGS) IMPLANT
NDL HYPO 25X1 1.5 SAFETY (NEEDLE) ×1 IMPLANT
NEEDLE HYPO 25X1 1.5 SAFETY (NEEDLE) ×2 IMPLANT
NS IRRIG 1000ML POUR BTL (IV SOLUTION) ×2 IMPLANT
PACK BASIC VI WITH GOWN DISP (CUSTOM PROCEDURE TRAY) ×2 IMPLANT
PAD ABD 8X10 STRL (GAUZE/BANDAGES/DRESSINGS) ×1 IMPLANT
SPONGE LAP 18X18 X RAY DECT (DISPOSABLE) IMPLANT
STAPLER VISISTAT 35W (STAPLE) IMPLANT
SUCTION POOLE TIP (SUCTIONS) ×1 IMPLANT
SUT MNCRL AB 4-0 PS2 18 (SUTURE) IMPLANT
SUT VIC AB 3-0 SH 18 (SUTURE) IMPLANT
SYR CONTROL 10ML LL (SYRINGE) ×2 IMPLANT
TOWEL OR 17X26 10 PK STRL BLUE (TOWEL DISPOSABLE) ×2 IMPLANT
YANKAUER SUCT BULB TIP 10FT TU (MISCELLANEOUS) ×2 IMPLANT

## 2015-12-13 NOTE — ED Provider Notes (Signed)
CSN: [id number]     Arrival date & time 12/13/15  [id number] History   First MD Initiated Contact with Patient 12/13/15 [phone]     Chief Complaint  Patient presents with  . Fall     (Consider location/radiation/quality/duration/timing/severity/associated sxs/prior Treatment) HPI   Patient is a 55 year old male presenting status post fall 2 weeks ago. Patient fell out of the back of his truck striking his left gluteal area on the bumper of his car. Initially had mild tenderness had x-rays done at his PCP which were negative. He's been having increasing pain in "tightness" in his left buttocks. Patient "noticed a fever in it". And came here for emergency department to be evaluated.  Patient is not had a fever himself.  Patient has tried Vicodin prescription provided by his primary care provider to help with the pain but has not been adequate.  Past medical history significant for hypertension, hypokalemia.  Past Medical History  Diagnosis Date  . Allergy   . Hypertension   . Sleep apnea   . Hyperlipidemia   . History of nephrolithiasis    Past Surgical History  Procedure Laterality Date  . Appendectomy    . Uvulectomy    . Lumbar laminectomy     Family History  Problem Relation Age of Onset  . Emphysema Mother   . Asthma Mother   . Heart disease Father   . Hyperlipidemia      family  hx  . Hypertension      family hx  . Stroke      family hx  . Sudden death      family hx   Social History  Substance Use Topics  . Smoking status: Never Smoker   . Smokeless tobacco: Never Used  . Alcohol Use: 0.0 oz/week    0 Standard drinks or equivalent per week     Comment: occ    Review of Systems  Constitutional: Negative for fever and activity change.  Eyes: Negative for discharge and redness.  Respiratory: Positive for cough. Negative for shortness of breath.   Cardiovascular: Negative for chest pain.  Gastrointestinal: Negative for abdominal pain.  Genitourinary: Negative  for dysuria and urgency.  Musculoskeletal: Negative for arthralgias.  Allergic/Immunologic: Negative for immunocompromised state.  Neurological: Negative for seizures.  All other systems reviewed and are negative.     Allergies  Hydrocodone and Zetia  Home Medications   Prior to Admission medications   Medication Sig Start Date End Date Taking? Authorizing Provider  indomethacin (INDOCIN) 50 MG capsule Take 1 capsule (50 mg total) by mouth 3 (three) times daily as needed. Patient taking differently: Take 50 mg by mouth 3 (three) times daily as needed for mild pain.  12/10/15  Yes Laurey Morale, MD  naproxen sodium (ANAPROX) 220 MG tablet Take 440 mg by mouth 2 (two) times daily as needed (for pain).   Yes Historical Provider, MD  promethazine (PHENERGAN) 25 MG tablet Take 1 tablet (25 mg total) by mouth every 4 (four) hours as needed for nausea or vomiting. 12/10/15  Yes Laurey Morale, MD  sertraline (ZOLOFT) 100 MG tablet TAKE 1.5 TABLETS BY MOUTH EVERYDAY 06/10/15  Yes Laurey Morale, MD  traZODone (DESYREL) 50 MG tablet TAKE 1 TABLET BY MOUTH AT BEDTIME 11/17/15  Yes Laurey Morale, MD  zolpidem (AMBIEN) 10 MG tablet TAKE 1 TABLET AT BEDTIME AS NEEDED Patient taking differently: TAKE 1 TABLET AT BEDTIME AS NEEDED for sleep 11/17/15  Yes Laurey Morale,  MD  atorvastatin (LIPITOR) 20 MG tablet TAKE 1 TABLET BY MOUTH EVERY DAY Patient not taking: Reported on 12/13/2015 11/01/13   Laurey Morale, MD   BP 120/75 mmHg  Pulse 117  Temp(Src) 97.9 F (36.6 C) (Oral)  Resp 18  Ht 6' 0.75" (1.848 m)  Wt 181 lb (82.101 kg)  BMI 24.04 kg/m2  SpO2 99% Physical Exam  Constitutional: He is oriented to person, place, and time. He appears well-nourished.  HENT:  Head: Normocephalic.  Mouth/Throat: Oropharynx is clear and moist.  Eyes: Conjunctivae are normal.  Neck: No tracheal deviation present.  Cardiovascular: Normal rate.   Pulmonary/Chest: Effort normal. No stridor. No respiratory distress.   Abdominal: Soft. There is no tenderness. There is no guarding.  Musculoskeletal: Normal range of motion. He exhibits no edema.  Patient has a large indurated area surrounding almost the entire left gluteal cheek. Small abrasion, well-healed from last week's trauma.  Indurated area is roughly 16 x 15 cm.  Neurological: He is oriented to person, place, and time. No cranial nerve deficit.  Skin: Skin is warm and dry. No rash noted. He is not diaphoretic.  Psychiatric: He has a normal mood and affect. His behavior is normal.  Nursing note and vitals reviewed.   ED Course  Procedures (including critical care time) Labs Review Labs Reviewed  COMPREHENSIVE METABOLIC PANEL - Abnormal; Notable for the following:    Sodium 132 (*)    Glucose, Bld 104 (*)    Calcium 8.3 (*)    Total Protein 5.9 (*)    Albumin 2.9 (*)    AST 51 (*)    Total Bilirubin 1.9 (*)    All other components within normal limits  CBC WITH DIFFERENTIAL/PLATELET - Abnormal; Notable for the following:    WBC 22.6 (*)    Neutro Abs 18.6 (*)    Monocytes Absolute 1.8 (*)    All other components within normal limits  PROTIME-INR - Abnormal; Notable for the following:    Prothrombin Time 15.3 (*)    All other components within normal limits  I-STAT CG4 LACTIC ACID, ED    Imaging Review Ct Pelvis W Contrast  12/13/2015  CLINICAL DATA:  Increasing left buttock pain since the patient fell from a truck 2 weeks ago and landed on his buttocks. EXAM: CT PELVIS WITH CONTRAST TECHNIQUE: Multidetector CT imaging of the pelvis was performed using the standard protocol following the bolus administration of intravenous contrast. CONTRAST:  159mL OMNIPAQUE IOHEXOL 300 MG/ML  SOLN COMPARISON:  Radiograph dated 12/08/2015 FINDINGS: There is a 12 x 12 x 7 cm in the abscess in the left buttock the trauma probably with in the hematoma which is with in the left gluteus maximus muscle. There is adjacent soft tissue edema or bruising. The bones  of the pelvis are normal. Visualized intrapelvic structures are normal. No significant adenopathy. IMPRESSION: Large infected hematoma arising within the substance of left gluteus maximus muscle but extending through the fascia into the subcutaneous fat. Electronically Signed   By: Lorriane Shire M.D.   On: 12/13/2015 12:09   I have personally reviewed and evaluated these images and lab results as part of my medical decision-making.   EKG Interpretation None      MDM   Final diagnoses:  None    Patient is a 55 year old male presenting with worsening pain after fall 2 weeks ago. Patient seen by primary care physician had negative x-rays. Patient presented in today with a large indurated area adjacent  to well here healing abrasion. Concern for large area of cellulitis versus abscess plus cellulitis. We'll get CT of the soft tissue of his pelvis. We'll get labs including lactate.  4:15 PM CT shows infected hematoma. Will discuss with general su surgery.  Will start abx.  General surgery came and admitted.    Cherokee Clowers Julio Alm, MD 12/13/15 1615

## 2015-12-13 NOTE — Progress Notes (Signed)
Pharmacy Antibiotic Note  [name] is a 55 y.o. male admitted on 12/13/2015 with an infected hematom.  Pharmacy has been consulted for vancomycin and zosyn dosing.  Plan: Vancomycin 1500mg  IV every 12 hours.  Goal trough 10-15 mcg/mL. Zosyn 3.375g IV q8h (4 hour infusion).  Follow up renal function & cultures De-escalate as appropriate  Height: 6' 0.75" (184.8 cm) Weight: 181 lb (82.101 kg) IBW/kg (Calculated) : 79.33  Temp (24hrs), Avg:98.1 F (36.7 C), Min:97.9 F (36.6 C), Max:98.2 F (36.8 C)   Recent Labs Lab 12/13/15 1035 12/13/15 1046  WBC 22.6*  --   CREATININE 0.88  --   LATICACIDVEN  --  1.44    Estimated Creatinine Clearance: 110.1 mL/min (by C-G formula based on Cr of 0.88).    Allergies  Allergen Reactions  . Hydrocodone Hives  . Zetia [Ezetimibe] Rash    Antimicrobials this admission: 3/10 >> Vancomycin >> 3/10 >> Zosyn >>  Dose adjustments this admission: ---  Microbiology results: none  Thank you for allowing pharmacy to be a part of this patient's care.  Peggyann Juba, PharmD, BCPS Pager: [phone] 12/13/2015 12:52 PM

## 2015-12-13 NOTE — Consult Note (Signed)
Reason for Consult:  Left buttocks hematoma/infected abscess Referring Physician: Dr. Gerald Leitz PCP:  Laurey Morale, MD   [name] is an 55 y.o. male.  HPI: Pt fell off truck 2 weeks ago striking his lower back against the tailgate.  He treated it with aleve and ice initially.  But developed more swelling and pain left buttocks.  He was seen by PCP ON 12/08/15, and treated with more NSAIDS, and pain medicines.  He returns to the ED and says pain has now become unbearable.  Films of the spine and pelvis on 12/08/15 were normal.   In the ED he is afebrile today, VSS.  Labs show WBC up to 22.6, Na 132, bilirubin is up some as is AST.   CT scan shows a 12x 12 x 7 Cm bv abscess in the left gluteus maximus muscle but extending through the fascia into the subcutaneous fat. .  We are ask to see.  Past Medical History  Diagnosis Date  . Allergy   . Hypertension   . Sleep apnea   . Hyperlipidemia   . History of nephrolithiasis     Past Surgical History  Procedure Laterality Date  . Appendectomy    . Uvulectomy    . Lumbar laminectomy      Family History  Problem Relation Age of Onset  . Emphysema Mother   . Asthma Mother   . Heart disease Father   . Hyperlipidemia      family  hx  . Hypertension      family hx  . Stroke      family hx  . Sudden death      family hx    Social History:  reports that he has never smoked. He has never used smokeless tobacco. He reports that he drinks alcohol. He reports that he does not use illicit drugs.  Allergies:  Allergies  Allergen Reactions  . Hydrocodone Hives  . Zetia [Ezetimibe] Rash    Prior to Admission medications   Medication Sig Start Date End Date Taking? Authorizing Provider  indomethacin (INDOCIN) 50 MG capsule Take 1 capsule (50 mg total) by mouth 3 (three) times daily as needed. Patient taking differently: Take 50 mg by mouth 3 (three) times daily as needed for mild pain.  12/10/15  Yes Laurey Morale, MD  naproxen  sodium (ANAPROX) 220 MG tablet Take 440 mg by mouth 2 (two) times daily as needed (for pain).   Yes Historical Provider, MD  promethazine (PHENERGAN) 25 MG tablet Take 1 tablet (25 mg total) by mouth every 4 (four) hours as needed for nausea or vomiting. 12/10/15  Yes Laurey Morale, MD  sertraline (ZOLOFT) 100 MG tablet TAKE 1.5 TABLETS BY MOUTH EVERYDAY 06/10/15  Yes Laurey Morale, MD  traZODone (DESYREL) 50 MG tablet TAKE 1 TABLET BY MOUTH AT BEDTIME 11/17/15  Yes Laurey Morale, MD  zolpidem (AMBIEN) 10 MG tablet TAKE 1 TABLET AT BEDTIME AS NEEDED Patient taking differently: TAKE 1 TABLET AT BEDTIME AS NEEDED for sleep 11/17/15  Yes Laurey Morale, MD  atorvastatin (LIPITOR) 20 MG tablet TAKE 1 TABLET BY MOUTH EVERY DAY Patient not taking: Reported on 12/13/2015 11/01/13   Laurey Morale, MD     Results for orders placed or performed during the hospital encounter of 12/13/15 (from the past 48 hour(s))  Comprehensive metabolic panel     Status: Abnormal   Collection Time: 12/13/15 10:35 AM  Result Value Ref Range  Sodium 132 (L) 135 - 145 mmol/L   Potassium 4.5 3.5 - 5.1 mmol/L   Chloride 101 101 - 111 mmol/L   CO2 22 22 - 32 mmol/L   Glucose, Bld 104 (H) 65 - 99 mg/dL   BUN 17 6 - 20 mg/dL   Creatinine, Ser 0.88 0.61 - 1.24 mg/dL   Calcium 8.3 (L) 8.9 - 10.3 mg/dL   Total Protein 5.9 (L) 6.5 - 8.1 g/dL   Albumin 2.9 (L) 3.5 - 5.0 g/dL   AST 51 (H) 15 - 41 U/L   ALT 55 17 - 63 U/L   Alkaline Phosphatase 76 38 - 126 U/L   Total Bilirubin 1.9 (H) 0.3 - 1.2 mg/dL   GFR calc non Af Amer >60 >60 mL/min   GFR calc Af Amer >60 >60 mL/min    Comment: (NOTE) The eGFR has been calculated using the CKD EPI equation. This calculation has not been validated in all clinical situations. eGFR's persistently <60 mL/min signify possible Chronic Kidney Disease.    Anion gap 9 5 - 15  CBC with Differential/Platelet     Status: Abnormal   Collection Time: 12/13/15 10:35 AM  Result Value Ref Range   WBC  22.6 (H) 4.0 - 10.5 K/uL   RBC 4.67 4.22 - 5.81 MIL/uL   Hemoglobin 14.1 13.0 - 17.0 g/dL   HCT 41.8 39.0 - 52.0 %   MCV 89.5 78.0 - 100.0 fL   MCH 30.2 26.0 - 34.0 pg   MCHC 33.7 30.0 - 36.0 g/dL   RDW 15.3 11.5 - 15.5 %   Platelets 264 150 - 400 K/uL   Neutrophils Relative % 82 %   Lymphocytes Relative 9 %   Monocytes Relative 8 %   Eosinophils Relative 1 %   Basophils Relative 0 %   Neutro Abs 18.6 (H) 1.7 - 7.7 K/uL   Lymphs Abs 2.0 0.7 - 4.0 K/uL   Monocytes Absolute 1.8 (H) 0.1 - 1.0 K/uL   Eosinophils Absolute 0.2 0.0 - 0.7 K/uL   Basophils Absolute 0.0 0.0 - 0.1 K/uL   WBC Morphology INCREASED BANDS (>20% BANDS)   Protime-INR     Status: Abnormal   Collection Time: 12/13/15 10:35 AM  Result Value Ref Range   Prothrombin Time 15.3 (H) 11.6 - 15.2 seconds   INR 1.24 0.00 - 1.49  I-Stat CG4 Lactic Acid, ED     Status: None   Collection Time: 12/13/15 10:46 AM  Result Value Ref Range   Lactic Acid, Venous 1.44 0.5 - 2.0 mmol/L    Ct Pelvis W Contrast  12/13/2015  CLINICAL DATA:  Increasing left buttock pain since the patient fell from a truck 2 weeks ago and landed on his buttocks. EXAM: CT PELVIS WITH CONTRAST TECHNIQUE: Multidetector CT imaging of the pelvis was performed using the standard protocol following the bolus administration of intravenous contrast. CONTRAST:  164m OMNIPAQUE IOHEXOL 300 MG/ML  SOLN COMPARISON:  Radiograph dated 12/08/2015 FINDINGS: There is a 12 x 12 x 7 cm in the abscess in the left buttock the trauma probably with in the hematoma which is with in the left gluteus maximus muscle. There is adjacent soft tissue edema or bruising. The bones of the pelvis are normal. Visualized intrapelvic structures are normal. No significant adenopathy. IMPRESSION: Large infected hematoma arising within the substance of left gluteus maximus muscle but extending through the fascia into the subcutaneous fat. Electronically Signed   By: JLorriane ShireM.D.   On: 12/13/2015  12:09    Review of Systems  Constitutional: Negative.   HENT: Negative.   Eyes: Negative.   Respiratory: Positive for cough.   Cardiovascular: Negative.   Gastrointestinal: Negative.   Genitourinary:       Hesitancy   Musculoskeletal:       He has a large 15 x 15 area left buttocks swollen, fluctulent and erythematous.  Extremely tender.    Skin: Negative.   Neurological: Negative.   Endo/Heme/Allergies: Negative.   Psychiatric/Behavioral: The patient is nervous/anxious.    Blood pressure 132/71, pulse 107, temperature 97.9 F (36.6 C), temperature source Oral, resp. rate 18, height 6' 0.75" (1.848 m), weight 82.101 kg (181 lb), SpO2 97 %. Physical Exam  Constitutional: He is oriented to person, place, and time. He appears well-developed and well-nourished. He appears distressed (he is having alllot of pain left buttocks).  HENT:  Head: Normocephalic and atraumatic.  Nose: Nose normal.  Eyes: EOM are normal. Right eye exhibits no discharge. Left eye exhibits no discharge. No scleral icterus.  Neck: Normal range of motion. Neck supple. No JVD present. No tracheal deviation present. No thyromegaly present.  Cardiovascular: Normal rate, regular rhythm, normal heart sounds and intact distal pulses.  Exam reveals no gallop.   No murmur heard. Respiratory: Effort normal and breath sounds normal. No respiratory distress. He has no wheezes. He has no rales. He exhibits no tenderness.  GI: Soft. Bowel sounds are normal. He exhibits no distension and no mass. There is no tenderness. There is no rebound and no guarding.  Musculoskeletal: He exhibits no edema or tenderness.  Lymphadenopathy:    He has no cervical adenopathy.  Neurological: He is alert and oriented to person, place, and time. No cranial nerve deficit.  Skin: He is not diaphoretic.  Left buttocks with 15 x 15 cm red raised fluctuant area.  Psychiatric: He has a normal mood and affect. His behavior is normal. Judgment and  thought content normal.    Assessment/Plan: Left gluteal hematoma and abscess Anxiety Sleep apnea - he uses CPAP some times Hx of nephrolithiasis Hyperlipidemia  Plan:  Starting Antibiotics in the ED, for I&D in OR later today.    Elesia Pemberton 12/13/2015, 12:52 PM

## 2015-12-13 NOTE — ED Notes (Addendum)
[place] off truck x 2 weeks ago, landed on buttocks. Has had increasing left buttock pain over last two weeks with swelling, redness, and warmth to the area. Was seen at PCP for this and told "to let it run it's course." Pain has now become unbearable. States they had x-rays done of hip with no breaks seen.

## 2015-12-13 NOTE — Anesthesia Postprocedure Evaluation (Signed)
Anesthesia Post Note  Patient: [name]  Procedure(s) Performed: Procedure(s) (LRB): IRRIGATION AND DEBRIDEMENT LEFT GLUTEAL HEMATOMA (Left)  Patient location during evaluation: PACU Anesthesia Type: General Level of consciousness: awake and alert Pain management: pain level controlled Vital Signs Assessment: post-procedure vital signs reviewed and stable Respiratory status: spontaneous breathing, nonlabored ventilation, respiratory function stable and patient connected to nasal cannula oxygen Cardiovascular status: blood pressure returned to baseline and stable Postop Assessment: no signs of nausea or vomiting Anesthetic complications: no    Last Vitals:  Filed Vitals:   12/13/15 1700 12/13/15 1734  BP: 116/64 138/89  Pulse: 118 119  Temp: 36.6 C 36.9 C  Resp: 15 14    Last Pain:  Filed Vitals:   12/13/15 1734  PainSc: 0-No pain                 Illeana Edick JENNETTE

## 2015-12-13 NOTE — Transfer of Care (Signed)
Immediate Anesthesia Transfer of Care Note  Patient: [name]  Procedure(s) Performed: Procedure(s): IRRIGATION AND DEBRIDEMENT LEFT GLUTEAL HEMATOMA (Left)  Patient Location: PACU  Anesthesia Type:General  Level of Consciousness: awake, alert  and oriented  Airway & Oxygen Therapy: Patient Spontanous Breathing and Patient connected to face mask oxygen  Post-op Assessment: Report given to RN and Post -op Vital signs reviewed and stable  Post vital signs: Reviewed and stable  Last Vitals:  Filed Vitals:   12/13/15 1330 12/13/15 1428  BP: 120/75 120/75  Pulse: 118 117  Temp:  36.6 C  Resp:  18    Complications: No apparent anesthesia complications

## 2015-12-13 NOTE — ED Notes (Signed)
EDP at bedside  

## 2015-12-13 NOTE — Anesthesia Procedure Notes (Signed)
Procedure Name: Intubation Date/Time: 12/13/2015 3:28 PM Performed by: Noralyn Pick D Pre-anesthesia Checklist: Patient identified, Emergency Drugs available, Suction available and Patient being monitored Patient Re-evaluated:Patient Re-evaluated prior to inductionOxygen Delivery Method: Circle System Utilized Preoxygenation: Pre-oxygenation with 100% oxygen Intubation Type: IV induction Ventilation: Mask ventilation without difficulty Laryngoscope Size: Mac and 4 Grade View: Grade II Tube type: Oral Tube size: 7.5 mm Number of attempts: 1 Airway Equipment and Method: Stylet and Oral airway Placement Confirmation: ETT inserted through vocal cords under direct vision,  positive ETCO2 and breath sounds checked- equal and bilateral Secured at: 22 cm Tube secured with: Tape Dental Injury: Teeth and Oropharynx as per pre-operative assessment

## 2015-12-13 NOTE — ED Notes (Signed)
PA  Surgery at bedside.

## 2015-12-13 NOTE — Op Note (Signed)
12/13/2015  4:05 PM  PATIENT:  [name]  55 y.o. male  Patient Care Team: Laurey Morale, MD as PCP - General  PRE-OPERATIVE DIAGNOSIS:  left gluteal abscess  POST-OPERATIVE DIAGNOSIS:  left gluteal abscess (infected hematoma)  PROCEDURE:   IRRIGATION AND DEBRIDEMENT LEFT GLUTEAL HEMATOMA  SURGEON:  Surgeon(s): Leighton Ruff, MD  ASSISTANT: none   ANESTHESIA:   general  EBL: 16ml  SPECIMEN:  Source of Specimen:  abscess cultures  DISPOSITION OF SPECIMEN:  Micro  COUNTS:  YES  PLAN OF CARE: Admit to inpatient   PATIENT DISPOSITION:  PACU - hemodynamically stable.  INDICATION: 55 y.o. M with a hematoma s/p fall.  Pain began to worsen and he presented to the ED.  CT scan showed a 12 cm abscess  Frequency of debridement: once Area of body debrided: L hip Presence and extent of infected tissue: extensive Presence and extent of non viable tissue: minimal  OR FINDINGS: Infected hematoma with ~1L and purulence and old blood with the subcutaneous tissue and extending into the muscle  DESCRIPTION: The patient was identified in the pre op holding area and taken to the OR, where they were laid on his side on the OR table.  General anesthesia was induced.  The operative area was prepped and draped in the usual sterile fashion.  A surgical time out was performed, indicating the correct patient, procedure, positioning and pre-operative antibiotics.    The total area of devitalized tissue was 0 externally.  A scalpel instrument was used to debride the wound.  Debridement was carried down to the level of muscle.  Skin, fat and muscle tissue was removed.  There was devitalized tissue material in the wound that would inhibit healing and or promote adjacent tissue breakdown.  At the end of the procedure the debrided area measured 15 x 12 x8cm.  The wound was then packed with betadine soaked Kerlex roll and covered with a sterile dressing.  The patient was awakened from anesthesia and  sent to the PACU in stable condition.  All counts were correct per OR staff.

## 2015-12-13 NOTE — ED Notes (Signed)
CONSENT OBTAINED BY PT

## 2015-12-13 NOTE — Telephone Encounter (Signed)
I left a voice message with below information. 

## 2015-12-13 NOTE — Anesthesia Preprocedure Evaluation (Signed)
Anesthesia Evaluation  Patient identified by MRN, date of birth, ID band Patient awake    Reviewed: Allergy & Precautions, NPO status , Patient's Chart, lab work & pertinent test results  History of Anesthesia Complications Negative for: history of anesthetic complications  Airway Mallampati: II  TM Distance: >3 FB Neck ROM: Full    Dental no notable dental hx. (+) Dental Advisory Given   Pulmonary sleep apnea ,    Pulmonary exam normal breath sounds clear to auscultation       Cardiovascular hypertension, Pt. on medications Normal cardiovascular exam Rhythm:Regular Rate:Normal     Neuro/Psych PSYCHIATRIC DISORDERS Anxiety negative neurological ROS     GI/Hepatic negative GI ROS, Neg liver ROS,   Endo/Other  negative endocrine ROS  Renal/GU negative Renal ROS  negative genitourinary   Musculoskeletal negative musculoskeletal ROS (+)   Abdominal   Peds negative pediatric ROS (+)  Hematology negative hematology ROS (+)   Anesthesia Other Findings   Reproductive/Obstetrics negative OB ROS                             Anesthesia Physical Anesthesia Plan  ASA: II  Anesthesia Plan: General   Post-op Pain Management:    Induction: Intravenous  Airway Management Planned: Oral ETT  Additional Equipment:   Intra-op Plan:   Post-operative Plan: Extubation in OR  Informed Consent: I have reviewed the patients History and Physical, chart, labs and discussed the procedure including the risks, benefits and alternatives for the proposed anesthesia with the patient or authorized representative who has indicated his/her understanding and acceptance.   Dental advisory given  Plan Discussed with: CRNA  Anesthesia Plan Comments:         Anesthesia Quick Evaluation

## 2015-12-13 NOTE — ED Notes (Signed)
Patient transported to CT 

## 2015-12-13 NOTE — Telephone Encounter (Signed)
I spoke with pharmacy and pt picked up both scripts on 12/10/2015.

## 2015-12-14 LAB — CBC
HEMATOCRIT: 36.8 % — AB (ref 39.0–52.0)
HEMOGLOBIN: 12.8 g/dL — AB (ref 13.0–17.0)
MCH: 30.3 pg (ref 26.0–34.0)
MCHC: 34.8 g/dL (ref 30.0–36.0)
MCV: 87 fL (ref 78.0–100.0)
Platelets: 276 10*3/uL (ref 150–400)
RBC: 4.23 MIL/uL (ref 4.22–5.81)
RDW: 15.5 % (ref 11.5–15.5)
WBC: 19.6 10*3/uL — AB (ref 4.0–10.5)

## 2015-12-14 MED ORDER — HEPARIN SODIUM (PORCINE) 5000 UNIT/ML IJ SOLN
5000.0000 [IU] | Freq: Three times a day (TID) | INTRAMUSCULAR | Status: DC
  Administered 2015-12-14 – 2015-12-15 (×3): 5000 [IU] via SUBCUTANEOUS
  Filled 2015-12-14 (×6): qty 1

## 2015-12-14 MED ORDER — HYDROMORPHONE HCL 2 MG PO TABS
1.0000 mg | ORAL_TABLET | ORAL | Status: DC | PRN
  Administered 2015-12-15: 2 mg via ORAL
  Filled 2015-12-14: qty 1

## 2015-12-14 MED ORDER — ACETAMINOPHEN 500 MG PO TABS
1000.0000 mg | ORAL_TABLET | Freq: Four times a day (QID) | ORAL | Status: DC
  Administered 2015-12-14: 1000 mg via ORAL
  Filled 2015-12-14 (×7): qty 2

## 2015-12-14 MED ORDER — IBUPROFEN 200 MG PO TABS: 600.0000 mg | ORAL_TABLET | Freq: Four times a day (QID) | ORAL | Status: DC | PRN

## 2015-12-14 MED ORDER — PANTOPRAZOLE SODIUM 40 MG PO TBEC
40.0000 mg | DELAYED_RELEASE_TABLET | Freq: Every day | ORAL | Status: DC
  Administered 2015-12-14: 40 mg via ORAL
  Filled 2015-12-14 (×3): qty 1

## 2015-12-14 MED ORDER — HYDROMORPHONE HCL 1 MG/ML IJ SOLN
0.5000 mg | INTRAMUSCULAR | Status: DC | PRN
  Administered 2015-12-14 – 2015-12-15 (×3): 1 mg via INTRAVENOUS
  Filled 2015-12-14 (×3): qty 1

## 2015-12-14 NOTE — Progress Notes (Signed)
PHARMACIST - PHYSICIAN COMMUNICATION Key Points: Use following P&T approved IV to PO antibiotic change policy.  Description contains the criteria that are approved Note: Policy Excludes:  Esophagectomy patients  DR:   CCS CONCERNING: IV to Oral Route Change Policy  RECOMMENDATION: This patient is receiving pantoprazole by the intravenous route.  Based on criteria approved by the Pharmacy and Therapeutics Committee, the intravenous medication(s) is/are being converted to the equivalent oral dose form(s).   DESCRIPTION: These criteria include:  The patient is eating (either orally or via tube) and/or has been taking other orally administered medications for a least 24 hours  The patient has no evidence of active gastrointestinal bleeding or impaired GI absorption (gastrectomy, short bowel, patient on TNA or NPO).  If you have questions about this conversion, please contact the Pharmacy Department  []   [phone] )  [facility] []   [phone] )  [facility] []   [phone] )  [facility] []   [phone] )  [facility] [x]   [phone] )  [place], PharmD, BCPS.   12/14/2015 1:46 PM

## 2015-12-14 NOTE — Addendum Note (Signed)
Addendum  created 12/14/15 [id number] by Lollie Sails, CRNA   Modules edited: Charges VN

## 2015-12-14 NOTE — Progress Notes (Signed)
Utilization review completed.  

## 2015-12-14 NOTE — Care Management Note (Signed)
Case Management Note  Patient Details  Name: [name] MRN: [id number] Date of Birth: 1961/09/23  Subjective/Objective:                  IRRIGATION AND DEBRIDEMENT LEFT GLUTEAL HEMATOMA (Left) Action/Plan: Discharge planning Expected Discharge Date:  12/16/15               Expected Discharge Plan:  [place]  In-House Referral:     Discharge planning Services  CM Consult  Post Acute Care Choice:  Home Health Choice offered to:  Patient, Spouse  DME Arranged:  N/A DME Agency:  NA  HH Arranged:  RN [place] Agency:  [place]  Status of Service:  Completed, signed off  Medicare Important Message Given:    Date Medicare IM Given:    Medicare IM give by:    Date Additional Medicare IM Given:    Additional Medicare Important Message give by:     If discussed at [place] of Stay Meetings, dates discussed:    Additional Comments: CM met with pt (sleeping) and wife, Butch Penny [phone], in room to offer choice of home health agency.  Butch Penny [facility] to render [facility] for dressing changes.  Referral called to [facility] rep, Santiago Glad.  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  No other CM needs were communicated. Dellie Catholic, RN 12/14/2015, 2:25 PM

## 2015-12-14 NOTE — Progress Notes (Signed)
1 Day Post-Op  Subjective: He feels better, they did his dressing change last PM and at 7 AM.  I took the ABD down, and he still has allot of erythema and induration with some possible fluctuance on his hip.  I will come back later this afternoon and take the whole thin down and look at it again.     Objective: Vital signs in last 24 hours: Temp:  [97.7 F (36.5 C)-98.4 F (36.9 C)] 97.7 F (36.5 C) (03/14 0539) Pulse Rate:  [104-121] 104 (03/14 0539) Resp:  [13-18] 16 (03/14 0539) BP: (110-138)/(57-89) 111/71 mmHg (03/14 0539) SpO2:  [93 %-100 %] 97 % (03/14 0539) Weight:  [82.101 kg (181 lb)] 82.101 kg (181 lb) (03/13 1038) Last BM Date: 12/12/15 400 urine Afebrile, Tachycardic VSS WBC still up 19.6 Intake/Output from previous day: 03/13 0701 - 03/14 0700 In: 1100 [I.V.:1100] Out: 425 [Urine:400; Blood:25] Intake/Output this shift:    General appearance: alert, cooperative, no distress and very tough guy. Skin: Open area looks good, I didn't take the whole thing down, but open are is clean, new packing in place.  The area over the crest of the abdomen is still very erytnematous, indurated and I wonder if there is another area here to be concerned with.   Lab Results:   Recent Labs  12/13/15 1035 12/14/15 0421  WBC 22.6* 19.6*  HGB 14.1 12.8*  HCT 41.8 36.8*  PLT 264 276    BMET  Recent Labs  12/13/15 1035  NA 132*  K 4.5  CL 101  CO2 22  GLUCOSE 104*  BUN 17  CREATININE 0.88  CALCIUM 8.3*   PT/INR  Recent Labs  12/13/15 1035  LABPROT 15.3*  INR 1.24     Recent Labs Lab 12/13/15 1035  AST 51*  ALT 55  ALKPHOS 76  BILITOT 1.9*  PROT 5.9*  ALBUMIN 2.9*     Lipase  No results found for: LIPASE   Studies/Results: Ct Pelvis W Contrast  12/13/2015  CLINICAL DATA:  Increasing left buttock pain since the patient fell from a truck 2 weeks ago and landed on his buttocks. EXAM: CT PELVIS WITH CONTRAST TECHNIQUE: Multidetector CT imaging of the  pelvis was performed using the standard protocol following the bolus administration of intravenous contrast. CONTRAST:  154mL OMNIPAQUE IOHEXOL 300 MG/ML  SOLN COMPARISON:  Radiograph dated 12/08/2015 FINDINGS: There is a 12 x 12 x 7 cm in the abscess in the left buttock the trauma probably with in the hematoma which is with in the left gluteus maximus muscle. There is adjacent soft tissue edema or bruising. The bones of the pelvis are normal. Visualized intrapelvic structures are normal. No significant adenopathy. IMPRESSION: Large infected hematoma arising within the substance of left gluteus maximus muscle but extending through the fascia into the subcutaneous fat. Electronically Signed   By: Lorriane Shire M.D.   On: 12/13/2015 12:09    Medications: . pantoprazole (PROTONIX) IV  40 mg Intravenous QHS  . piperacillin-tazobactam (ZOSYN)  IV  3.375 g Intravenous Q8H  . sertraline  100 mg Oral Daily  . vancomycin  1,500 mg Intravenous Q12H    Assessment/Plan Left gluteal hematoma and abscess IRRIGATION AND DEBRIDEMENT LEFT GLUTEAL HEMATOMA, 99991111, Dr. Leighton Ruff Anxiety Sleep apnea - he uses CPAP some times Hx of nephrolithiasis Hyperlipidemia Antibiotics:  Zosyn/Vancomycin day 2 DVT:  Heparin/SCD   Plan:  Continue antibiotics, I will take the pack down again later this afternoon and look at it. Recheck  labs in AM, and await cultures.  I will begin getting home health to help and teach wife how to do dressing changes.      LOS: 1 day    Teoman Giraud 12/14/2015

## 2015-12-15 LAB — CBC
HCT: 36.1 % — ABNORMAL LOW (ref 39.0–52.0)
HEMOGLOBIN: 12.3 g/dL — AB (ref 13.0–17.0)
MCH: 29.6 pg (ref 26.0–34.0)
MCHC: 34.1 g/dL (ref 30.0–36.0)
MCV: 86.8 fL (ref 78.0–100.0)
PLATELETS: 304 10*3/uL (ref 150–400)
RBC: 4.16 MIL/uL — AB (ref 4.22–5.81)
RDW: 15.6 % — AB (ref 11.5–15.5)
WBC: 13.9 10*3/uL — AB (ref 4.0–10.5)

## 2015-12-15 LAB — BASIC METABOLIC PANEL
Anion gap: 8 (ref 5–15)
BUN: 14 mg/dL (ref 6–20)
CALCIUM: 7.4 mg/dL — AB (ref 8.9–10.3)
CO2: 27 mmol/L (ref 22–32)
CREATININE: 1.06 mg/dL (ref 0.61–1.24)
Chloride: 103 mmol/L (ref 101–111)
GFR calc non Af Amer: >60 mL/min (ref >60)
Glucose, Bld: 84 mg/dL (ref 65–99)
Potassium: 4.3 mmol/L (ref 3.5–5.1)
SODIUM: 138 mmol/L (ref 135–145)

## 2015-12-15 LAB — VANCOMYCIN, TROUGH: VANCOMYCIN TR: 10 ug/mL (ref 10.0–20.0)

## 2015-12-15 MED ORDER — ACETAMINOPHEN 500 MG PO TABS: 1000.0000 mg | ORAL_TABLET | Freq: Four times a day (QID) | ORAL | Status: DC

## 2015-12-15 MED ORDER — HYDROMORPHONE HCL 2 MG PO TABS
1.0000 mg | ORAL_TABLET | ORAL | Status: DC | PRN
Start: 2015-12-15 — End: 2017-07-04

## 2015-12-15 MED ORDER — SULFAMETHOXAZOLE-TRIMETHOPRIM 800-160 MG PO TABS: 1.0000 | ORAL_TABLET | Freq: Two times a day (BID) | ORAL | Status: DC

## 2015-12-15 MED ORDER — VANCOMYCIN HCL 10 G IV SOLR
1250.0000 mg | Freq: Three times a day (TID) | INTRAVENOUS | Status: DC
  Filled 2015-12-15 (×2): qty 1250

## 2015-12-15 NOTE — Progress Notes (Signed)
Patient loss IV access, per MD Marcello Moores, IV access did not need to be restarted due to pending discharge.

## 2015-12-15 NOTE — Discharge Instructions (Signed)
Perirectal Abscess An abscess is an infected area that contains a collection of pus. A perirectal abscess is an abscess that is near the opening of the anus or around the rectum. A perirectal abscess can cause a lot of pain, especially during bowel movements. CAUSES This condition is almost always caused by an infection that starts in an anal gland. RISK FACTORS This condition is more likely to develop in:  People with diabetes or inflammatory bowel disease.  People whose body defense system (immune system) is weak.  People who have anal sex.  People who have a sexually transmitted disease (STD).  People who have certain kinds of cancers, such as rectal carcinoma, leukemia, or lymphoma. SYMPTOMS The main symptom of this condition is pain. The pain may be a throbbing pain that gets worse during bowel movements. Other symptoms include:  Fever.  Swelling.  Redness.  Bleeding.  Constipation. DIAGNOSIS The condition is diagnosed with a physical exam. If the abscess is not visible, a health care provider may need to place a finger inside the rectum to find the abscess. Sometimes, imaging tests are done to determine the size and location of the abscess. These tests may include:  An ultrasound.  An MRI.  A CT scan. TREATMENT This condition is usually treated with incision and drainage surgery. Incision and drainage surgery involves making an incision over the abscess to drain the pus. Treatment may also involve antibiotic medicine, pain medicine, stool softeners, or laxatives. HOME CARE INSTRUCTIONS  Take medicines only as directed by your health care provider.  If you were prescribed an antibiotic, finish all of it even if you start to feel better.  To relieve pain, try sitting:  In a warm, shallow bath (sitz bath).  On a heating pad with the setting on low.  On an inflatable donut-shaped cushion.  Follow any diet instructions as directed by your health care  provider.  Keep all follow-up visits as directed by your health care provider. This is important. SEEK MEDICAL CARE IF:  Your abscess is bleeding.  You have pain, swelling, or redness that is getting worse.  You are constipated.  You feel ill.  You have muscle aches or chills.  You have a fever.  Your symptoms return after the abscess has healed.   This information is not intended to replace advice given to you by your health care provider. Make sure you discuss any questions you have with your health care provider.   Document Released: 09/15/2000 Document Revised: 06/09/2015 Document Reviewed: 07/29/2014 Elsevier Interactive Patient Education 2016 [place] A dressing is a material placed over wounds. It keeps the wound clean, dry, and protected from further injury.  BEFORE YOU BEGIN  Get your supplies together. Things you may need include:  Salt solution (saline).  Flexible gauze bandage.  Tape.  Gloves.  Belly (abdominal) pads.  Gauze squares.  Plastic bags.  Take pain medicine 30 minutes before the bandage change if you need it.  Take a shower before you do the first bandage change of the day. Put plastic wrap or a bag over the dressing. REMOVING YOUR OLD BANDAGE  Wash your hands with soap and water. Dry your hands with a clean towel.  Put on your gloves.  Remove any tape.  Remove the old bandage as told. If it sticks, put a small amount of warm water on it to loosen the bandage.  Remove any gauze or packing tape in your wound.  Take off your  gloves.  Put the gloves, tape, gauze, or any packing tape in a plastic bag. CHANGING YOUR BANDAGE  Open the supplies.  Take the cap off the salt solution.  Open the gauze. Leave the gauze on the inside of the package.  Put on your gloves.  Clean your wound as told by your doctor.  Keep your wound dry if your doctor told you to do so.  Your doctor may tell you to do one or more  of the following:  Pick up the gauze. Pour the salt solution over the gauze. Squeeze out the extra salt solution.  Put medicated cream or other medicine on your wound.  Put solution soaked gauze only in your wound, not on the skin around it.  Pack your wound loosely.  Put dry gauze on your wound.  Put belly pads over the dry gauze if your bandages soak through.  Tape the bandage in place so it will not fall off. Do not wrap the tape all the way around your arm or leg.  Wrap the bandage with the flexible gauze bandage as told by your doctor.  Take off your gloves. Put them in the plastic bag with the old bandage. Tie the bag shut and throw it away.  Keep the bandage clean and dry.  Wash your hands. GET HELP RIGHT AWAY IF:   Your skin around the wound looks red.  Your wound feels more tender or sore.  You see yellowish-white fluid (pus) in the wound.  Your wound smells bad.  You have a fever.  Your skin around the wound has a red rash that itches and burns.  You see black or yellow skin in your wound that was not there before.  You feel sick to your stomach (nauseous), throw up (vomit), and feel very tired.   This information is not intended to replace advice given to you by your health care provider. Make sure you discuss any questions you have with your health care provider.   Document Released: 12/15/2008 Document Revised: 10/09/2014 Document Reviewed: 07/30/2011 Elsevier Interactive Patient Education [employer].

## 2015-12-15 NOTE — Progress Notes (Signed)
Patient alert and oriented with pain controlled. Patient given prescriptions and discharge instructions. Spouse was present for teaching on dressing changes. Verbalized and demonstrated knowledge on packing wound. Patient and family verbalized understanding of discharge teaching and home care.

## 2015-12-15 NOTE — Progress Notes (Signed)
2 Days Post-Op  Subjective: Anxious to go home. He still has allot of cellulitis, and still indurated, but not as much as yesterday.  His wife is going to repack.    Objective: Vital signs in last 24 hours: Temp:  [97.8 F (36.6 C)-98.8 F (37.1 C)] 98 F (36.7 C) (03/15 0548) Pulse Rate:  [94-109] 94 (03/15 0548) Resp:  [16-18] 16 (03/15 0548) BP: (102-124)/(62-79) 124/79 mmHg (03/15 0548) SpO2:  [95 %-97 %] 95 % (03/15 0548) Last BM Date: 12/12/15 Afebrile, VSS Labs OK, WBC down to 13.9  Culture gm + cocci  No organism yet Intake/Output from previous day: 03/14 0701 - 03/15 0700 In: 120 [P.O.:120] Out: 400 [Urine:400] Intake/Output this shift:    General appearance: alert, cooperative and no distress Skin: Skin color, texture, turgor normal. No rashes or lesions or sitll has a fair amount of erythema, fair amount of drainage from the open site noted on dressing when it was removed.  Induration is better.  Lab Results:   Recent Labs  12/14/15 0421 12/15/15 0437  WBC 19.6* 13.9*  HGB 12.8* 12.3*  HCT 36.8* 36.1*  PLT 276 304    BMET  Recent Labs  12/13/15 1035 12/15/15 0437  NA 132* 138  K 4.5 4.3  CL 101 103  CO2 22 27  GLUCOSE 104* 84  BUN 17 14  CREATININE 0.88 1.06  CALCIUM 8.3* 7.4*   PT/INR  Recent Labs  12/13/15 1035  LABPROT 15.3*  INR 1.24     Recent Labs Lab 12/13/15 1035  AST 51*  ALT 55  ALKPHOS 76  BILITOT 1.9*  PROT 5.9*  ALBUMIN 2.9*     Lipase  No results found for: LIPASE   Studies/Results: Ct Pelvis W Contrast  12/13/2015  CLINICAL DATA:  Increasing left buttock pain since the patient fell from a truck 2 weeks ago and landed on his buttocks. EXAM: CT PELVIS WITH CONTRAST TECHNIQUE: Multidetector CT imaging of the pelvis was performed using the standard protocol following the bolus administration of intravenous contrast. CONTRAST:  195mL OMNIPAQUE IOHEXOL 300 MG/ML  SOLN COMPARISON:  Radiograph dated 12/08/2015  FINDINGS: There is a 12 x 12 x 7 cm in the abscess in the left buttock the trauma probably with in the hematoma which is with in the left gluteus maximus muscle. There is adjacent soft tissue edema or bruising. The bones of the pelvis are normal. Visualized intrapelvic structures are normal. No significant adenopathy. IMPRESSION: Large infected hematoma arising within the substance of left gluteus maximus muscle but extending through the fascia into the subcutaneous fat. Electronically Signed   By: Lorriane Shire M.D.   On: 12/13/2015 12:09    Medications: . acetaminophen  1,000 mg Oral Q6H  . heparin subcutaneous  5,000 Units Subcutaneous 3 times per day  . pantoprazole  40 mg Oral Daily  . piperacillin-tazobactam (ZOSYN)  IV  3.375 g Intravenous Q8H  . sertraline  100 mg Oral Daily  . vancomycin  1,500 mg Intravenous Q12H    Assessment/Plan Left gluteal hematoma and abscess IRRIGATION AND DEBRIDEMENT LEFT GLUTEAL HEMATOMA, 99991111, Dr. Leighton Ruff Anxiety Sleep apnea - he uses CPAP some times Hx of nephrolithiasis Hyperlipidemia Antibiotics: Zosyn/Vancomycin day 3 DVT: Heparin/SCD   Plan:  i think we should keep him another day, but he is very anxious about going home.  I don't have a culture result yet.  I will look at the culture results again later and see if we have something to  guide oral antibiotic treatment and discuss with Dr. Marcello Moores.     LOS: 2 days    Jammi Morrissette 12/15/2015  3

## 2015-12-15 NOTE — Progress Notes (Addendum)
Pharmacy Antibiotic Note  [name] is a 55 y.o. male admitted on 12/13/2015 with an infected hematoma and L gluteal abscess.  Pharmacy has been consulted for vancomycin and zosyn dosing.  S/p I&D 3/13.  Abscess culture showing abundant GPC in clusters.  Plan: Check vancomycin trough this afternoon.  Goal trough 15-20 mcg/mL for abscess. Continue Zosyn 3.375g IV q8h (4 hour infusion time).  Follow up result of abscess culture and de-escalate as appropriate.  ADDENDUM, 1:55 PM: Vancomycin trough subtherapeutic for treating abscess.  Will adjust vanc to 1250 mg IV q8h.  F/u culture results for narrowing therapy.  Height: 6' 0.75" (184.8 cm) Weight: 181 lb (82.101 kg) IBW/kg (Calculated) : 79.33  Temp (24hrs), Avg:98.2 F (36.8 C), Min:97.8 F (36.6 C), Max:98.8 F (37.1 C)   Recent Labs Lab 12/13/15 1035 12/13/15 1046 12/14/15 0421 12/15/15 0437  WBC 22.6*  --  19.6* 13.9*  CREATININE 0.88  --   --  1.06  LATICACIDVEN  --  1.44  --   --     Estimated Creatinine Clearance: 91.4 mL/min (by C-G formula based on Cr of 1.06).    Allergies  Allergen Reactions  . Hydrocodone Hives  . Zetia [Ezetimibe] Rash    Antimicrobials this admission: 3/10 >> Vancomycin >> 3/10 >> Zosyn >>  Dose adjustments this admission: 3/15 1300 VT: 10 on 1500 mg q12h, change to 1250 mg IV q8h  Microbiology results: 3/13 L gluteal abscess: GPC clusters 3/13 L gluteal abscess (anaerobic cx): no anaerobes isolated  Thank you for allowing pharmacy to be a part of this patient's care.  Hershal Coria, PharmD, BCPS Pager: [phone] 12/15/2015 9:48 AM

## 2015-12-16 LAB — CULTURE, ROUTINE-ABSCESS: CULTURE: NORMAL

## 2015-12-16 NOTE — H&P (Signed)
Attestation signed by Leighton Ruff, MD at 5/00/9381 2:53 PM  ATTENDING ADDENDUM:  I personally reviewed patient's record, examined the patient, and formulated the following assessment and plan:  Pt with large mass on L hip consistent with fluid collection seen on CT. I recommend I&D with washout of hematoma. Risks include bleeding, infection and recurrence. Pt agrees to proceed.   Rosario Adie, MD  Colorectal and General Surgery [facility]      Expand All Collapse All   Reason for Consult: Left buttocks hematoma/infected abscess Referring Physician: Dr. Gerald Leitz PCP: Laurey Morale, MD   AVRAHAM BENISH is an 55 y.o. male.  HPI: Pt fell off truck 2 weeks ago striking his lower back against the tailgate. He treated it with aleve and ice initially. But developed more swelling and pain left buttocks. He was seen by PCP ON 12/08/15, and treated with more NSAIDS, and pain medicines. He returns to the ED and says pain has now become unbearable. Films of the spine and pelvis on 12/08/15 were normal.  In the ED he is afebrile today, VSS. Labs show WBC up to 22.6, Na 132, bilirubin is up some as is AST.  CT scan shows a 12x 12 x 7 Cm bv abscess in the left gluteus maximus muscle but extending through the fascia into the subcutaneous fat. . We are ask to see.  Past Medical History  Diagnosis Date  . Allergy   . Hypertension   . Sleep apnea   . Hyperlipidemia   . History of nephrolithiasis     Past Surgical History  Procedure Laterality Date  . Appendectomy    . Uvulectomy    . Lumbar laminectomy      Family History  Problem Relation Age of Onset  . Emphysema Mother   . Asthma Mother   . Heart disease Father   . Hyperlipidemia      family hx  . Hypertension      family hx  . Stroke      family hx  . Sudden death      family hx    Social History:   reports that he has never smoked. He has never used smokeless tobacco. He reports that he drinks alcohol. He reports that he does not use illicit drugs.  Allergies:  Allergies  Allergen Reactions  . Hydrocodone Hives  . Zetia [Ezetimibe] Rash    Prior to Admission medications   Medication Sig Start Date End Date Taking? Authorizing Provider  indomethacin (INDOCIN) 50 MG capsule Take 1 capsule (50 mg total) by mouth 3 (three) times daily as needed. Patient taking differently: Take 50 mg by mouth 3 (three) times daily as needed for mild pain.  12/10/15  Yes Laurey Morale, MD  naproxen sodium (ANAPROX) 220 MG tablet Take 440 mg by mouth 2 (two) times daily as needed (for pain).   Yes Historical Provider, MD  promethazine (PHENERGAN) 25 MG tablet Take 1 tablet (25 mg total) by mouth every 4 (four) hours as needed for nausea or vomiting. 12/10/15  Yes Laurey Morale, MD  sertraline (ZOLOFT) 100 MG tablet TAKE 1.5 TABLETS BY MOUTH EVERYDAY 06/10/15  Yes Laurey Morale, MD  traZODone (DESYREL) 50 MG tablet TAKE 1 TABLET BY MOUTH AT BEDTIME 11/17/15  Yes Laurey Morale, MD  zolpidem (AMBIEN) 10 MG tablet TAKE 1 TABLET AT BEDTIME AS NEEDED Patient taking differently: TAKE 1 TABLET AT BEDTIME AS NEEDED for sleep 11/17/15  Yes Annie Main  Raymon Mutton, MD  atorvastatin (LIPITOR) 20 MG tablet TAKE 1 TABLET BY MOUTH EVERY DAY Patient not taking: Reported on 12/13/2015 11/01/13   Laurey Morale, MD      Lab Results Last 48 Hours    Results for orders placed or performed during the hospital encounter of 12/13/15 (from the past 48 hour(s))  Comprehensive metabolic panel Status: Abnormal   Collection Time: 12/13/15 10:35 AM  Result Value Ref Range   Sodium 132 (L) 135 - 145 mmol/L   Potassium 4.5 3.5 - 5.1 mmol/L   Chloride 101 101 - 111 mmol/L   CO2 22 22 - 32 mmol/L   Glucose, Bld 104 (H) 65 - 99 mg/dL   BUN 17 6 - 20 mg/dL    Creatinine, Ser 0.88 0.61 - 1.24 mg/dL   Calcium 8.3 (L) 8.9 - 10.3 mg/dL   Total Protein 5.9 (L) 6.5 - 8.1 g/dL   Albumin 2.9 (L) 3.5 - 5.0 g/dL   AST 51 (H) 15 - 41 U/L   ALT 55 17 - 63 U/L   Alkaline Phosphatase 76 38 - 126 U/L   Total Bilirubin 1.9 (H) 0.3 - 1.2 mg/dL   GFR calc non Af Amer >60 >60 mL/min   GFR calc Af Amer >60 >60 mL/min    Comment: (NOTE) The eGFR has been calculated using the CKD EPI equation. This calculation has not been validated in all clinical situations. eGFR's persistently <60 mL/min signify possible Chronic Kidney Disease.    Anion gap 9 5 - 15  CBC with Differential/Platelet Status: Abnormal   Collection Time: 12/13/15 10:35 AM  Result Value Ref Range   WBC 22.6 (H) 4.0 - 10.5 K/uL   RBC 4.67 4.22 - 5.81 MIL/uL   Hemoglobin 14.1 13.0 - 17.0 g/dL   HCT 41.8 39.0 - 52.0 %   MCV 89.5 78.0 - 100.0 fL   MCH 30.2 26.0 - 34.0 pg   MCHC 33.7 30.0 - 36.0 g/dL   RDW 15.3 11.5 - 15.5 %   Platelets 264 150 - 400 K/uL   Neutrophils Relative % 82 %   Lymphocytes Relative 9 %   Monocytes Relative 8 %   Eosinophils Relative 1 %   Basophils Relative 0 %   Neutro Abs 18.6 (H) 1.7 - 7.7 K/uL   Lymphs Abs 2.0 0.7 - 4.0 K/uL   Monocytes Absolute 1.8 (H) 0.1 - 1.0 K/uL   Eosinophils Absolute 0.2 0.0 - 0.7 K/uL   Basophils Absolute 0.0 0.0 - 0.1 K/uL   WBC Morphology INCREASED BANDS (>20% BANDS)   Protime-INR Status: Abnormal   Collection Time: 12/13/15 10:35 AM  Result Value Ref Range   Prothrombin Time 15.3 (H) 11.6 - 15.2 seconds   INR 1.24 0.00 - 1.49  I-Stat CG4 Lactic Acid, ED Status: None   Collection Time: 12/13/15 10:46 AM  Result Value Ref Range   Lactic Acid, Venous 1.44 0.5 - 2.0 mmol/L       Imaging Results (Last 48 hours)    Ct Pelvis W Contrast  12/13/2015 CLINICAL  DATA: Increasing left buttock pain since the patient fell from a truck 2 weeks ago and landed on his buttocks. EXAM: CT PELVIS WITH CONTRAST TECHNIQUE: Multidetector CT imaging of the pelvis was performed using the standard protocol following the bolus administration of intravenous contrast. CONTRAST: 134m OMNIPAQUE IOHEXOL 300 MG/ML SOLN COMPARISON: Radiograph dated 12/08/2015 FINDINGS: There is a 12 x 12 x 7 cm in the abscess in the left buttock  the trauma probably with in the hematoma which is with in the left gluteus maximus muscle. There is adjacent soft tissue edema or bruising. The bones of the pelvis are normal. Visualized intrapelvic structures are normal. No significant adenopathy. IMPRESSION: Large infected hematoma arising within the substance of left gluteus maximus muscle but extending through the fascia into the subcutaneous fat. Electronically Signed By: Lorriane Shire M.D. On: 12/13/2015 12:09     Review of Systems  Constitutional: Negative.  HENT: Negative.  Eyes: Negative.  Respiratory: Positive for cough.  Cardiovascular: Negative.  Gastrointestinal: Negative.  Genitourinary:   Hesitancy  Musculoskeletal:   He has a large 15 x 15 area left buttocks swollen, fluctulent and erythematous. Extremely tender.  Skin: Negative.  Neurological: Negative.  Endo/Heme/Allergies: Negative.  Psychiatric/Behavioral: The patient is nervous/anxious.   Blood pressure 132/71, pulse 107, temperature 97.9 F (36.6 C), temperature source Oral, resp. rate 18, height 6' 0.75" (1.848 m), weight 82.101 kg (181 lb), SpO2 97 %. Physical Exam  Constitutional: He is oriented to person, place, and time. He appears well-developed and well-nourished. He appears distressed (he is having alllot of pain left buttocks).  HENT:  Head: Normocephalic and atraumatic.  Nose: Nose normal.  Eyes: EOM are normal. Right eye exhibits no discharge. Left eye exhibits no discharge. No  scleral icterus.  Neck: Normal range of motion. Neck supple. No JVD present. No tracheal deviation present. No thyromegaly present.  Cardiovascular: Normal rate, regular rhythm, normal heart sounds and intact distal pulses. Exam reveals no gallop.  No murmur heard. Respiratory: Effort normal and breath sounds normal. No respiratory distress. He has no wheezes. He has no rales. He exhibits no tenderness.  GI: Soft. Bowel sounds are normal. He exhibits no distension and no mass. There is no tenderness. There is no rebound and no guarding.  Musculoskeletal: He exhibits no edema or tenderness.  Lymphadenopathy:   He has no cervical adenopathy.  Neurological: He is alert and oriented to person, place, and time. No cranial nerve deficit.  Skin: He is not diaphoretic.  Left buttocks with 15 x 15 cm red raised fluctuant area.  Psychiatric: He has a normal mood and affect. His behavior is normal. Judgment and thought content normal.    Assessment/Plan: Left gluteal hematoma and abscess Anxiety Sleep apnea - he uses CPAP some times Hx of nephrolithiasis Hyperlipidemia  Plan: Starting Antibiotics in the ED, for I&D in OR later today.   Dajae Kizer 12/13/2015, 12:52 PM           Cosigned by: Leighton Ruff, MD at 3/83/3383 2:53 PM

## 2015-12-16 NOTE — Discharge Summary (Signed)
Physician Discharge Summary  Patient ID: [name] MRN: [id number] DOB/AGE: 1961/03/13 55 y.o.  Admit date: 12/13/2015 Discharge date: 12/16/2015  Admission Diagnoses:  Left gluteal hematoma and abscess Anxiety Sleep apnea - he uses CPAP some times Hx of nephrolithiasis Hyperlipidemia  Discharge Diagnoses:  Left gluteal hematoma and abscess Anxiety Sleep apnea - he uses CPAP some times Hx of nephrolithiasis Hyperlipidemia  Active Problems:   Abscess, gluteal, left   PROCEDURES:  IRRIGATION AND DEBRIDEMENT LEFT GLUTEAL HEMATOMA, [id number], Dr. Moishe Spice Course:  HPI: Pt fell off truck 2 weeks ago striking his lower back against the tailgate. He treated it with aleve and ice initially. But developed more swelling and pain left buttocks. He was seen by PCP ON 12/08/15, and treated with more NSAIDS, and pain medicines. He returns to the ED and says pain has now become unbearable. Films of the spine and pelvis on 12/08/15 were normal.  In the ED he is afebrile today, VSS. Labs show WBC up to 22.6, Na 132, bilirubin is up some as is AST.  CT scan shows a 12x 12 x 7 Cm bv abscess in the left gluteus maximus muscle but extending through the fascia into the subcutaneous fat. . We are ask to see. He was in a good deal of pain with a large fluctuant areas with significant cellulitis in the ED.  We admitted him and started him on Zosyn and Vancomycin.  He was taken to the ED later that afternoon and underwent I&D of the site.  He had a significant defect after I&D, taking the largest portion of a Kerlix to pack.  He was started on dressing changes and antibiotic were continued after the surgery.  Day 2 post op he was pretty adamant about going home.  We kept him through the day, and his wife did both dressing changes. (Wet to dry with normal saline and Kerlix)   The culture was still not finished by 5 PM so we let him go home with wet to dry dressing using Kerlix.  Home  health was arranged.  He was sent home on Septra DS for 14 days.    Condition on d/c:  Improving        3d ago Abscess culture   Specimen Description ABSCESS LEFT GLUTEAL   Special Requests NONE   Gram Stain ABUNDANT WBC PRESENT, PREDOMINANTLY PMN  NO SQUAMOUS EPITHELIAL CELLS SEEN  ABUNDANT GRAM POSITIVE COCCI IN CLUSTERS  Performed at [facility]       Culture NORMAL SKIN FLORA  Note: NO STAPHYLOCOCCUS AUREUS ISOLATED NO GROUP A STREP (S.PYOGENES) ISOLATED            The anaerobic portion is not finished.    CBC Latest Ref Rng 12/15/2015 12/14/2015 12/13/2015  WBC 4.0 - 10.5 K/uL 13.9(H) 19.6(H) 22.6(H)  Hemoglobin 13.0 - 17.0 g/dL 12.3(L) 12.8(L) 14.1  Hematocrit 39.0 - 52.0 % 36.1(L) 36.8(L) 41.8  Platelets 150 - 400 K/uL 304 276 264   CMP Latest Ref Rng 12/15/2015 12/13/2015 10/16/2012  Glucose 65 - 99 mg/dL 84 104(H) 89  BUN 6 - 20 mg/dL 14 17 14   Creatinine 0.61 - 1.24 mg/dL 1.06 0.88 0.9  Sodium 135 - 145 mmol/L 138 132(L) 137  Potassium 3.5 - 5.1 mmol/L 4.3 4.5 3.6  Chloride 101 - 111 mmol/L 103 101 103  CO2 22 - 32 mmol/L 27 22 29   Calcium 8.9 - 10.3 mg/dL 7.4(L) 8.3(L) 9.2  Total Protein 6.5 - 8.1  g/dL - 5.9(L) 6.6  Total Bilirubin 0.3 - 1.2 mg/dL - 1.9(H) 1.1  Alkaline Phos 38 - 126 U/L - 76 63  AST 15 - 41 U/L - 51(H) 13  ALT 17 - 63 U/L - 55 14    Disposition: 01-Home or Self Care     Medication List    STOP taking these medications        naproxen sodium 220 MG tablet  Commonly known as:  ANAPROX     promethazine 25 MG tablet  Commonly known as:  PHENERGAN      TAKE these medications        acetaminophen 500 MG tablet  Commonly known as:  TYLENOL  Take 2 tablets (1,000 mg total) by mouth every 6 (six) hours.     atorvastatin 20 MG tablet  Commonly known as:  LIPITOR  TAKE 1 TABLET BY MOUTH EVERY DAY     HYDROmorphone 2 MG tablet  Commonly known as:  DILAUDID  Take 0.5-1 tablets (1-2 mg total) by mouth every 4 (four) hours  as needed for moderate pain or severe pain.     indomethacin 50 MG capsule  Commonly known as:  INDOCIN  Take 1 capsule (50 mg total) by mouth 3 (three) times daily as needed.     sertraline 100 MG tablet  Commonly known as:  ZOLOFT  TAKE 1.5 TABLETS BY MOUTH EVERYDAY     sulfamethoxazole-trimethoprim 800-160 MG tablet  Commonly known as:  BACTRIM DS,SEPTRA DS  Take 1 tablet by mouth 2 (two) times daily.     traZODone 50 MG tablet  Commonly known as:  DESYREL  TAKE 1 TABLET BY MOUTH AT BEDTIME     zolpidem 10 MG tablet  Commonly known as:  AMBIEN  TAKE 1 TABLET AT BEDTIME AS NEEDED           Follow-up Information    Follow up with [facility].   Why:  Home health nurse for dressing changes   Contact information:   [address] [place] [address] [phone]       Follow up with Rosario Adie., MD On [id number].   Specialty:  General Surgery   Why:  your appointment is at 2:10 PM, be at the office at 1:40 for check in.     Contact information:   [place] [address] [place] [address] [phone]       Signed: Earnstine Regal 12/16/2015, 12:38 PM

## 2015-12-18 LAB — ANAEROBIC CULTURE

## 2016-01-07 DIAGNOSIS — G4730 Sleep apnea, unspecified: Secondary | ICD-10-CM | POA: Diagnosis not present

## 2016-01-07 DIAGNOSIS — E785 Hyperlipidemia, unspecified: Secondary | ICD-10-CM | POA: Diagnosis not present

## 2016-01-07 DIAGNOSIS — I10 Essential (primary) hypertension: Secondary | ICD-10-CM | POA: Diagnosis not present

## 2016-01-07 DIAGNOSIS — L0231 Cutaneous abscess of buttock: Secondary | ICD-10-CM | POA: Diagnosis not present

## 2016-01-09 DIAGNOSIS — L0231 Cutaneous abscess of buttock: Secondary | ICD-10-CM | POA: Diagnosis not present

## 2016-01-09 DIAGNOSIS — I10 Essential (primary) hypertension: Secondary | ICD-10-CM | POA: Diagnosis not present

## 2016-01-09 DIAGNOSIS — G4730 Sleep apnea, unspecified: Secondary | ICD-10-CM | POA: Diagnosis not present

## 2016-01-09 DIAGNOSIS — E785 Hyperlipidemia, unspecified: Secondary | ICD-10-CM | POA: Diagnosis not present

## 2016-01-22 DIAGNOSIS — I10 Essential (primary) hypertension: Secondary | ICD-10-CM | POA: Diagnosis not present

## 2016-01-22 DIAGNOSIS — G4730 Sleep apnea, unspecified: Secondary | ICD-10-CM | POA: Diagnosis not present

## 2016-01-22 DIAGNOSIS — L0231 Cutaneous abscess of buttock: Secondary | ICD-10-CM | POA: Diagnosis not present

## 2016-01-22 DIAGNOSIS — E785 Hyperlipidemia, unspecified: Secondary | ICD-10-CM | POA: Diagnosis not present

## 2016-02-15 DIAGNOSIS — M7582 Other shoulder lesions, left shoulder: Secondary | ICD-10-CM | POA: Diagnosis not present

## 2016-02-29 ENCOUNTER — Other Ambulatory Visit: Payer: Self-pay | Admitting: Family Medicine

## 2016-04-18 ENCOUNTER — Other Ambulatory Visit: Payer: Self-pay | Admitting: Family Medicine

## 2016-04-19 NOTE — Telephone Encounter (Signed)
Call in #30 with 5 rf 

## 2016-04-24 ENCOUNTER — Other Ambulatory Visit: Payer: BLUE CROSS/BLUE SHIELD

## 2016-05-01 ENCOUNTER — Telehealth: Payer: Self-pay | Admitting: Family Medicine

## 2016-05-01 ENCOUNTER — Encounter: Payer: BLUE CROSS/BLUE SHIELD | Admitting: Family Medicine

## 2016-05-01 DIAGNOSIS — Z0289 Encounter for other administrative examinations: Secondary | ICD-10-CM

## 2016-05-01 NOTE — Telephone Encounter (Signed)
Pt was on schedule for a physical today, did not show. I left a voice message for pt to reschedule this.

## 2016-06-21 ENCOUNTER — Other Ambulatory Visit: Payer: Self-pay | Admitting: Family Medicine

## 2016-06-23 NOTE — Telephone Encounter (Signed)
Okay to refill? 

## 2016-08-31 ENCOUNTER — Telehealth: Payer: Self-pay | Admitting: Family Medicine

## 2016-08-31 NOTE — Telephone Encounter (Signed)
I spoke with pt and he is going to check with pharmacy.

## 2016-08-31 NOTE — Telephone Encounter (Signed)
Pt request refill  zolpidem (AMBIEN) 10 MG tablet   Please note new [facility] change  [facility]

## 2016-08-31 NOTE — Telephone Encounter (Signed)
NO, too soon. Not due until 10-21-16

## 2016-09-19 DIAGNOSIS — S838X1A Sprain of other specified parts of right knee, initial encounter: Secondary | ICD-10-CM | POA: Diagnosis not present

## 2016-11-03 ENCOUNTER — Other Ambulatory Visit: Payer: Self-pay | Admitting: Family Medicine

## 2016-11-06 DIAGNOSIS — F413 Other mixed anxiety disorders: Secondary | ICD-10-CM | POA: Diagnosis not present

## 2016-11-06 NOTE — Telephone Encounter (Signed)
Call in #30 with 5 rf 

## 2016-11-13 ENCOUNTER — Other Ambulatory Visit: Payer: Self-pay | Admitting: Family Medicine

## 2016-11-21 DIAGNOSIS — F413 Other mixed anxiety disorders: Secondary | ICD-10-CM | POA: Diagnosis not present

## 2017-05-02 ENCOUNTER — Other Ambulatory Visit: Payer: Self-pay | Admitting: Family Medicine

## 2017-05-03 NOTE — Telephone Encounter (Signed)
Call in #30 with 5 rf 

## 2017-07-04 ENCOUNTER — Ambulatory Visit (INDEPENDENT_AMBULATORY_CARE_PROVIDER_SITE_OTHER): Payer: BLUE CROSS/BLUE SHIELD | Admitting: Family Medicine

## 2017-07-04 ENCOUNTER — Encounter: Payer: Self-pay | Admitting: Family Medicine

## 2017-07-04 VITALS — Temp 98.3°F | Ht 72.75 in | Wt 177.0 lb

## 2017-07-04 DIAGNOSIS — S0502XA Injury of conjunctiva and corneal abrasion without foreign body, left eye, initial encounter: Secondary | ICD-10-CM | POA: Diagnosis not present

## 2017-07-04 MED ORDER — TOBRAMYCIN 0.3 % OP SOLN: 2.0000 [drp] | OPHTHALMIC | 0 refills | Status: DC

## 2017-07-04 NOTE — Progress Notes (Signed)
   Subjective:    Patient ID: [name], male    DOB: Oct 10, 1960, 56 y.o.   MRN: [id number]  HPI Here for an injury to the left eye that happened 36 hours ago. While clearing some brush a branch struck him in the face. The left eye has been painful and teary since then, although his vision is clear.    Review of Systems  Constitutional: Negative.   HENT: Negative.   Eyes: Positive for pain and redness. Negative for discharge and visual disturbance.  Respiratory: Negative.        Objective:   Physical Exam  Constitutional: He appears well-developed and well-nourished.  HENT:  Right Ear: External ear normal.  Left Ear: External ear normal.  Nose: Nose normal.  Mouth/Throat: Oropharynx is clear and moist.  Eyes: Pupils are equal, round, and reactive to light. EOM are normal.  Under fluorescein exam there is a small superficial abrasion to the inferior left cornea   Neck: Neck supple. No thyromegaly present.  Cardiovascular: Normal rate, regular rhythm, normal heart sounds and intact distal pulses.   Pulmonary/Chest: Effort normal and breath sounds normal. No respiratory distress. He has no wheezes. He has no rales.  Lymphadenopathy:    He has no cervical adenopathy.          Assessment & Plan:  Corneal abrasion. He will treat with Tobramycin drops and an eye patch. Recheck prn.  Alysia Penna, MD

## 2017-07-04 NOTE — Patient Instructions (Signed)
WE NOW OFFER   [facility] FAST TRACK!!!  SAME DAY Appointments for ACUTE CARE  Such as: Sprains, Injuries, cuts, abrasions, rashes, muscle pain, joint pain, back pain Colds, flu, sore throats, headache, allergies, cough, fever  Ear pain, sinus and eye infections Abdominal pain, nausea, vomiting, diarrhea, upset stomach Animal/insect bites  3 Easy Ways to Schedule: Walk-In Scheduling Call in scheduling Mychart Sign-up: [email or web address]         

## 2017-10-24 ENCOUNTER — Ambulatory Visit: Payer: BLUE CROSS/BLUE SHIELD | Admitting: Family Medicine

## 2017-10-24 ENCOUNTER — Encounter: Payer: Self-pay | Admitting: Family Medicine

## 2017-10-24 VITALS — BP 116/78 | HR 89 | Temp 97.8°F | Wt 182.8 lb

## 2017-10-24 DIAGNOSIS — F5101 Primary insomnia: Secondary | ICD-10-CM

## 2017-10-24 DIAGNOSIS — Z0000 Encounter for general adult medical examination without abnormal findings: Secondary | ICD-10-CM

## 2017-10-24 DIAGNOSIS — F411 Generalized anxiety disorder: Secondary | ICD-10-CM | POA: Diagnosis not present

## 2017-10-24 DIAGNOSIS — J018 Other acute sinusitis: Secondary | ICD-10-CM

## 2017-10-24 MED ORDER — AZITHROMYCIN 250 MG PO TABS: ORAL_TABLET | ORAL | 0 refills | Status: DC

## 2017-10-24 MED ORDER — TRAZODONE HCL 50 MG PO TABS: 50.0000 mg | ORAL_TABLET | Freq: Every day | ORAL | 3 refills | Status: DC

## 2017-10-24 MED ORDER — SERTRALINE HCL 100 MG PO TABS: 100.0000 mg | ORAL_TABLET | Freq: Every day | ORAL | 5 refills | Status: DC

## 2017-10-24 MED ORDER — ZOLPIDEM TARTRATE 10 MG PO TABS: 10.0000 mg | ORAL_TABLET | Freq: Every evening | ORAL | 5 refills | Status: DC | PRN

## 2017-10-24 NOTE — Progress Notes (Signed)
   Subjective:    Patient ID: [name], male    DOB: [birth year], 57 y.o.   MRN: [id number]  HPI Here for several issues. First he has had 4 weeks of sinus pressure, PND, and a dry cough. Also his anxiety has been a problem lately due to work issues, and he wants to get back on Zoloft. Last year he worked his dose up to 150 mg daily for awhile and then he stopped it because he felt he no longer needed it. He still requires Trazodone and Ambien for sleep.    Review of Systems  Constitutional: Negative.   HENT: Positive for congestion, postnasal drip, sinus pressure and sinus pain. Negative for sore throat.   Eyes: Negative.   Respiratory: Positive for cough.   Psychiatric/Behavioral: Positive for dysphoric mood and sleep disturbance. The patient is nervous/anxious.        Objective:   Physical Exam  Constitutional: He is oriented to person, place, and time. He appears well-developed and well-nourished.  HENT:  Right Ear: External ear normal.  Left Ear: External ear normal.  Nose: Nose normal.  Mouth/Throat: Oropharynx is clear and moist.  Eyes: Conjunctivae are normal.  Neck: No thyromegaly present.  Pulmonary/Chest: Effort normal and breath sounds normal. No respiratory distress. He has no wheezes. He has no rales.  Lymphadenopathy:    He has no cervical adenopathy.  Neurological: He is alert and oriented to person, place, and time.  Psychiatric: He has a normal mood and affect. His behavior is normal. Judgment and thought content normal.          Assessment & Plan:  Treat the sinusitis with a Zpack. Add Mucinex prn. For the anxiety, start back on Zoloft 100 mg daily. Recheck in 3-4 weeks.  Alysia Penna, MD

## 2017-11-02 ENCOUNTER — Telehealth: Payer: Self-pay | Admitting: Family Medicine

## 2017-11-02 NOTE — Telephone Encounter (Signed)
Copied from [place]. Topic: Quick Communication - See Telephone Encounter >> Nov 02, 2017 10:14 AM Corie Chiquito, NT wrote: CRM for notification. See Telephone encounter for: Patient calling because he was in the office and talked with Dr.Fry about doubing the quantity of his Zolpidem. Stated when he picked up the medication that it was only 30 pills instead of 60 pills. Pharmacy showed him the hard script and it was written for only 30. Patient would like to know if the prescription could be rewritten for the 60 pills that was discussed. He would like [facility] [place]. [phone]  11/02/17.

## 2017-11-05 NOTE — Telephone Encounter (Signed)
Duplicated message.

## 2017-11-05 NOTE — Telephone Encounter (Signed)
Copied from [place]. Topic: Quick Communication - See Telephone Encounter >> Nov 02, 2017 10:14 AM Corie Chiquito, NT wrote: CRM for notification. See Telephone encounter for: Patient calling because he was in the office and talked with Dr.Fry about doubing the county of his Zolpidem. Stated when he picked up the medication that it was only 30 pills instead of 60 pills. Pharmacy showed him the hard script and it was written for only 30. Patient would like to know if the prescription could be rewritten for the 60 pills that was discussed. He would like the [facility] [place]. [phone]  11/02/17. >> Nov 05, 2017 12:44 PM Neva Seat wrote: Pt called back to check on his Rx that was supposed to be for 60 days not 30.  Please call pt asap to let him know.

## 2017-11-05 NOTE — Telephone Encounter (Signed)
Pt   Requesting  Zolpiedem   Dose    Change    LOV   10/24/2017   Last filled    10/24/2017    Pt  Was  Under the  Impression  That  Dosage  Would  Be  Increased   [facility]  [address]   [place]

## 2017-11-05 NOTE — Telephone Encounter (Signed)
Sent to PCP for approval for dose increase for Zolpiedem

## 2017-11-06 MED ORDER — ZOLPIDEM TARTRATE 10 MG PO TABS: 20.0000 mg | ORAL_TABLET | Freq: Every evening | ORAL | 5 refills | Status: DC | PRN

## 2017-11-06 NOTE — Telephone Encounter (Signed)
Called and spoke with pt. Pt advised and voiced understanding. Removed zolpidem 10 MG once at bedtime as needed for sleep from pt's med list. When I called in the RX and left a VM with new increased dose of zolpidem I asked for them to remove the old RX due to dose increase.

## 2017-11-06 NOTE — Telephone Encounter (Signed)
Call in Zolpidem 10 mg to take 2 tabs qhs, #60 with 5 rf

## 2017-11-26 ENCOUNTER — Encounter: Payer: Self-pay | Admitting: Family Medicine

## 2017-12-26 ENCOUNTER — Ambulatory Visit: Payer: BLUE CROSS/BLUE SHIELD | Admitting: Family Medicine

## 2017-12-26 DIAGNOSIS — Z0289 Encounter for other administrative examinations: Secondary | ICD-10-CM

## 2017-12-27 ENCOUNTER — Encounter: Payer: Self-pay | Admitting: Family Medicine

## 2017-12-27 ENCOUNTER — Ambulatory Visit: Payer: BLUE CROSS/BLUE SHIELD | Admitting: Family Medicine

## 2017-12-27 VITALS — BP 140/90 | HR 74 | Temp 97.8°F | Ht 70.75 in | Wt 175.6 lb

## 2017-12-27 DIAGNOSIS — J0191 Acute recurrent sinusitis, unspecified: Secondary | ICD-10-CM | POA: Diagnosis not present

## 2017-12-27 MED ORDER — AMOXICILLIN-POT CLAVULANATE 875-125 MG PO TABS: 1.0000 | ORAL_TABLET | Freq: Two times a day (BID) | ORAL | 0 refills | Status: DC

## 2017-12-27 NOTE — Progress Notes (Signed)
   Subjective:    Patient ID: [name], male    DOB: 09/30/1961, 57 y.o.   MRN: [id number]  HPI Here for 2 weeks of sinus pressure, PND, and blowing green mucus from the nose.    Review of Systems  Constitutional: Negative.   HENT: Positive for congestion, postnasal drip, sinus pressure and sinus pain. Negative for sore throat.   Eyes: Negative.   Respiratory: Positive for cough.        Objective:   Physical Exam  Constitutional: He appears well-developed and well-nourished.  HENT:  Right Ear: External ear normal.  Left Ear: External ear normal.  Nose: Nose normal.  Mouth/Throat: Oropharynx is clear and moist.  Eyes: Conjunctivae are normal.  Neck: No thyromegaly present.  Pulmonary/Chest: Effort normal and breath sounds normal. No respiratory distress. He has no wheezes. He has no rales.  Lymphadenopathy:    He has no cervical adenopathy.          Assessment & Plan:  Sinusitis, treat with Augmentin. Alysia Penna, MD

## 2018-01-14 ENCOUNTER — Other Ambulatory Visit: Payer: Self-pay | Admitting: Family Medicine

## 2018-03-12 ENCOUNTER — Other Ambulatory Visit: Payer: Self-pay | Admitting: Family Medicine

## 2018-03-22 DIAGNOSIS — F413 Other mixed anxiety disorders: Secondary | ICD-10-CM | POA: Diagnosis not present

## 2018-05-08 ENCOUNTER — Other Ambulatory Visit: Payer: Self-pay | Admitting: Family Medicine

## 2018-05-09 NOTE — Telephone Encounter (Signed)
Rx has been faxed in.  

## 2018-05-09 NOTE — Telephone Encounter (Signed)
Last fill 11/06/17 Last OV 12/27/17  Ok to fill?

## 2018-05-09 NOTE — Telephone Encounter (Signed)
Call in #60 with 5 rf 

## 2018-05-13 ENCOUNTER — Other Ambulatory Visit: Payer: Self-pay | Admitting: Family Medicine

## 2018-08-09 ENCOUNTER — Ambulatory Visit: Payer: Self-pay | Admitting: *Deleted

## 2018-08-09 NOTE — Telephone Encounter (Signed)
Will send to Dr. Fry as FYI 

## 2018-08-09 NOTE — Telephone Encounter (Signed)
Summary: please advise   Pt is calling and his urine has foul odor with discoloration no blood nor fever no pain. Pt request Monday appt with dr fry. Please advise     Patient is describing almost cyclic vomiting weekly occurring during the night. It is violent and discolored. Patient reports this has been going on for 3-4 weeks now.  Patient reports he has noticed that his urine is very dark. He reports no other urinary symptoms. Patient is very busy at work and has made appointment for Monday- he has agreed if he has an episode before that- he will go ED.  Reason for Disposition . Vomiting is a chronic symptom (recurrent or ongoing AND present > 4 weeks)  Answer Assessment - Initial Assessment Questions 1. VOMITING SEVERITY: "How many times have you vomited in the past 24 hours?"     - MILD:  1 - 2 times/day    - MODERATE: 3 - 5 times/day, decreased oral intake without significant weight loss or symptoms of dehydration    - SEVERE: 6 or more times/day, vomits everything or nearly everything, with significant weight loss, symptoms of dehydration      Patient reports he only vomits at night- violent vomiting- next day patient is fine- once weekly 2. ONSET: "When did the vomiting begin?"      3-4 weeks 3. FLUIDS: "What fluids or food have you vomited up today?" "Have you been able to keep any fluids down?"     Patient only vomits at night- once weekly- Tuesday/Wednesday 4. ABDOMINAL PAIN: "Are your having any abdominal pain?" If yes : "How bad is it and what does it feel like?" (e.g., crampy, dull, intermittent, constant)      No abdominal pain 5. DIARRHEA: "Is there any diarrhea?" If so, ask: "How many times today?"      no 6. CONTACTS: "Is there anyone else in the family with the same symptoms?"      no 7. CAUSE: "What do you think is causing your vomiting?"     Unknown cause 8. HYDRATION STATUS: "Any signs of dehydration?" (e.g., dry mouth [not only dry lips], too weak to stand) "When  did you last urinate?"     Drinking fluids staying hydrated 9. OTHER SYMPTOMS: "Do you have any other symptoms?" (e.g., fever, headache, vertigo, vomiting blood or coffee grounds, recent head injury)     Vomit looks thick- chocolate 10. PREGNANCY: "Is there any chance you are pregnant?" "When was your last menstrual period?"       n/a  Protocols used: [facility]

## 2018-08-12 ENCOUNTER — Ambulatory Visit: Payer: BLUE CROSS/BLUE SHIELD | Admitting: Family Medicine

## 2018-08-12 ENCOUNTER — Encounter: Payer: Self-pay | Admitting: Family Medicine

## 2018-08-12 ENCOUNTER — Ambulatory Visit (INDEPENDENT_AMBULATORY_CARE_PROVIDER_SITE_OTHER): Payer: BLUE CROSS/BLUE SHIELD

## 2018-08-12 VITALS — BP 138/92 | HR 72 | Temp 98.4°F | Wt 176.2 lb

## 2018-08-12 DIAGNOSIS — N390 Urinary tract infection, site not specified: Secondary | ICD-10-CM

## 2018-08-12 DIAGNOSIS — S335XXA Sprain of ligaments of lumbar spine, initial encounter: Secondary | ICD-10-CM | POA: Diagnosis not present

## 2018-08-12 DIAGNOSIS — S3992XA Unspecified injury of lower back, initial encounter: Secondary | ICD-10-CM | POA: Diagnosis not present

## 2018-08-12 DIAGNOSIS — R8290 Unspecified abnormal findings in urine: Secondary | ICD-10-CM

## 2018-08-12 DIAGNOSIS — S239XXA Sprain of unspecified parts of thorax, initial encounter: Secondary | ICD-10-CM

## 2018-08-12 DIAGNOSIS — S299XXA Unspecified injury of thorax, initial encounter: Secondary | ICD-10-CM | POA: Diagnosis not present

## 2018-08-12 DIAGNOSIS — M546 Pain in thoracic spine: Secondary | ICD-10-CM | POA: Diagnosis not present

## 2018-08-12 LAB — POCT URINALYSIS DIPSTICK
Bilirubin, UA: POSITIVE
Blood, UA: NEGATIVE
Glucose, UA: NEGATIVE
KETONES UA: NEGATIVE
Leukocytes, UA: NEGATIVE
NITRITE UA: NEGATIVE
PH UA: 6 (ref 5.0–8.0)
PROTEIN UA: POSITIVE — AB
Spec Grav, UA: 1.025 (ref 1.010–1.025)
UROBILINOGEN UA: 2 U/dL — AB

## 2018-08-12 MED ORDER — CYCLOBENZAPRINE HCL 10 MG PO TABS: 10.0000 mg | ORAL_TABLET | Freq: Three times a day (TID) | ORAL | 2 refills | Status: DC | PRN

## 2018-08-12 MED ORDER — METHYLPREDNISOLONE 4 MG PO TBPK: ORAL_TABLET | ORAL | 0 refills | Status: DC

## 2018-08-12 MED ORDER — CIPROFLOXACIN HCL 500 MG PO TABS: 500.0000 mg | ORAL_TABLET | Freq: Two times a day (BID) | ORAL | 0 refills | Status: DC

## 2018-08-12 NOTE — Progress Notes (Signed)
   Subjective:    Patient ID: [name], male    DOB: Aug 30, 1961, 57 y.o.   MRN: [id number]  HPI Here for 2 issues. First for the past 3 weeks he has had increased frequency of urinations and the urine has had a foul odor. Also on 08-10-18 while at work he fell off a ladder about 20 feet onto his back. No LOC or head injury. He has had stiffness and pain in the middle and lower back since then. He has used heat and has taken Aleve and some hydrocodone he had at home.   Review of Systems  Constitutional: Negative.   Respiratory: Negative.   Cardiovascular: Negative.   Gastrointestinal: Negative.   Genitourinary: Positive for frequency and urgency. Negative for dysuria.  Musculoskeletal: Positive for back pain.  Neurological: Negative.        Objective:   Physical Exam  Constitutional: He is oriented to person, place, and time.  In pain   HENT:  Head: Normocephalic and atraumatic.  Neck: Normal range of motion. Neck supple. No thyromegaly present.  Cardiovascular: Normal rate, regular rhythm, normal heart sounds and intact distal pulses.  Pulmonary/Chest: Effort normal and breath sounds normal.  Abdominal: Soft. Bowel sounds are normal. He exhibits no distension and no mass. There is no tenderness. There is no rebound and no guarding.  Musculoskeletal:  There is a lot of spasm in the middle and lower back with tenderness, but the ROM is preserved. Negative SLR   Lymphadenopathy:    He has no cervical adenopathy.  Neurological: He is alert and oriented to person, place, and time. No cranial nerve deficit.          Assessment & Plan:  He has sprains to the thoracic and lumbar areas. We will send him for Xrays. Given Flexeril and a Medrol dose pack. Treat the UTI with Cipro, and culture the sample.  Alysia Penna, MD

## 2018-08-13 LAB — URINE CULTURE
MICRO NUMBER:: 91355323
SPECIMEN QUALITY: ADEQUATE

## 2018-08-14 ENCOUNTER — Telehealth: Payer: Self-pay | Admitting: *Deleted

## 2018-08-14 NOTE — Telephone Encounter (Signed)
Called the pt to give him results of the xrays per Dr. Fry---he stated that he wanted to wait another 2 weeks to see how he feels at that time but did request that he be given some pain meds to last him the next 2 weeks to help out with the pain.  Dr. Sarajane Jews please advise. Thanks

## 2018-08-16 NOTE — Telephone Encounter (Signed)
Okay. Call in Tramadol 50 mg to take 2 tabs every 6 hours prn pain, #60 with one rf

## 2018-08-19 MED ORDER — TRAMADOL HCL 50 MG PO TABS: ORAL_TABLET | ORAL | 1 refills | Status: DC

## 2018-08-19 NOTE — Telephone Encounter (Signed)
Refill has been called to the pharmacy.  

## 2018-08-22 DIAGNOSIS — M545 Low back pain: Secondary | ICD-10-CM | POA: Diagnosis not present

## 2018-08-27 DIAGNOSIS — M545 Low back pain: Secondary | ICD-10-CM | POA: Diagnosis not present

## 2018-09-02 DIAGNOSIS — S32020A Wedge compression fracture of second lumbar vertebra, initial encounter for closed fracture: Secondary | ICD-10-CM | POA: Diagnosis not present

## 2018-09-18 DIAGNOSIS — S32020A Wedge compression fracture of second lumbar vertebra, initial encounter for closed fracture: Secondary | ICD-10-CM | POA: Diagnosis not present

## 2018-09-18 DIAGNOSIS — M47816 Spondylosis without myelopathy or radiculopathy, lumbar region: Secondary | ICD-10-CM | POA: Diagnosis not present

## 2018-09-27 DIAGNOSIS — M4856XA Collapsed vertebra, not elsewhere classified, lumbar region, initial encounter for fracture: Secondary | ICD-10-CM | POA: Diagnosis not present

## 2018-09-27 DIAGNOSIS — M5137 Other intervertebral disc degeneration, lumbosacral region: Secondary | ICD-10-CM | POA: Diagnosis not present

## 2018-09-27 DIAGNOSIS — Z79899 Other long term (current) drug therapy: Secondary | ICD-10-CM | POA: Diagnosis not present

## 2018-09-27 DIAGNOSIS — S32020A Wedge compression fracture of second lumbar vertebra, initial encounter for closed fracture: Secondary | ICD-10-CM | POA: Diagnosis not present

## 2018-10-16 ENCOUNTER — Other Ambulatory Visit: Payer: Self-pay | Admitting: Family Medicine

## 2018-11-10 ENCOUNTER — Telehealth: Payer: Self-pay | Admitting: Family Medicine

## 2018-11-13 NOTE — Telephone Encounter (Signed)
Last rx given on 8/8 #60 with 5 ref

## 2018-11-15 MED ORDER — ZOLPIDEM TARTRATE 10 MG PO TABS: ORAL_TABLET | ORAL | 5 refills | Status: DC

## 2018-11-15 NOTE — Telephone Encounter (Signed)
The pharmacy did not receive refill. The transmission did not go through. Please resend to pharmacy.  [id number] [name], [place] [address] [phone] (Phone) [phone] (Fax)

## 2018-11-15 NOTE — Telephone Encounter (Signed)
zolpidem (AMBIEN) 10 MG tablet 60 tablet 5 11/14/2018    Sig: TAKE 2 TABLET BY MOUTH AT BEDTIME AS NEEDED   Sent to pharmacy as: zolpidem (AMBIEN) 10 MG tablet   Notes to Pharmacy: This request is for a new prescription for a controlled substance as required by Federal/State law.   E-Prescribing Status: Transmission to pharmacy failed (11/14/2018 8:11 AM EST)    Routing back to provider

## 2018-11-15 NOTE — Addendum Note (Signed)
Addended by: Elie Confer on: 11/15/2018 12:16 PM   Modules accepted: Orders

## 2018-11-15 NOTE — Telephone Encounter (Signed)
Refill called to the pharmacy and left on the VM 

## 2019-01-12 ENCOUNTER — Other Ambulatory Visit: Payer: Self-pay | Admitting: Family Medicine

## 2019-01-14 NOTE — Telephone Encounter (Signed)
Dr. Fry please advise on refill. Thanks  

## 2019-01-25 ENCOUNTER — Other Ambulatory Visit: Payer: Self-pay | Admitting: Family Medicine

## 2019-05-09 ENCOUNTER — Other Ambulatory Visit: Payer: Self-pay | Admitting: Family Medicine

## 2019-05-09 NOTE — Telephone Encounter (Signed)
Last filled 05/13/2018 Last OV 08/12/2018  Ok to fill?

## 2019-05-13 ENCOUNTER — Other Ambulatory Visit: Payer: Self-pay

## 2019-05-13 DIAGNOSIS — Z20822 Contact with and (suspected) exposure to covid-19: Secondary | ICD-10-CM

## 2019-05-14 LAB — NOVEL CORONAVIRUS, NAA: SARS-CoV-2, NAA: NOT DETECTED

## 2019-06-02 ENCOUNTER — Other Ambulatory Visit: Payer: Self-pay | Admitting: Family Medicine

## 2019-06-23 ENCOUNTER — Other Ambulatory Visit: Payer: Self-pay | Admitting: Family Medicine

## 2019-06-23 NOTE — Telephone Encounter (Signed)
Medication Refill - Medication: zolpidem (AMBIEN) 10 MG tablet   Preferred Pharmacy (with phone number or street name):  [facility], [place] [address] [phone] (Phone) [phone] (Fax)     Patient has an appt. Scheduled for Friday 9/25.

## 2019-06-23 NOTE — Telephone Encounter (Signed)
Requested medication (s) are due for refill today: yes  Requested medication (s) are on the active medication list: yes  Last refill:  11/15/2018  Future visit scheduled: yes  Notes to clinic:  Refill cannot be delegated    Requested Prescriptions  Pending Prescriptions Disp Refills   zolpidem (AMBIEN) 10 MG tablet 60 tablet 5    Sig: TAKE 2 TABLET BY MOUTH AT BEDTIME AS NEEDED     Not Delegated - Psychiatry:  Anxiolytics/Hypnotics Failed - 06/23/2019  1:03 PM      Failed - This refill cannot be delegated      Failed - Urine Drug Screen completed in last 360 days.      Failed - Valid encounter within last 6 months    Recent Outpatient Visits          10 months ago Thoracic back sprain, initial [facility], MD   1 year ago Acute recurrent sinusitis, unspecified location   [facility], Ishmael Holter, MD   1 year ago Other acute sinusitis, recurrence not specified   [facility], Ishmael Holter, MD   1 year ago Abrasion of left cornea, initial [facility] [facility], MD   3 years ago Back contusion, left, initial [facility], Ishmael Holter, MD      Future Appointments            In 4 days Laurey Morale, MD [employer] [facility] [place], [facility]

## 2019-06-23 NOTE — Telephone Encounter (Signed)
Patient need to schedule an ov for more refills. Lm for pt to return a call.

## 2019-06-26 IMAGING — DX DG THORACIC SPINE 2V
3 series · 3 of 3 positions shown · non-contrast
Comparison: None.

CLINICAL DATA: Patient reports falling 20 feet from a ladder
landing on his back with persistent low back pain. There was
initially some radiation into the lower extremities immediately
following the injury yesterday but this has resolved.

EXAM:
THORACIC SPINE 2 VIEWS

[thoracic spine ap]
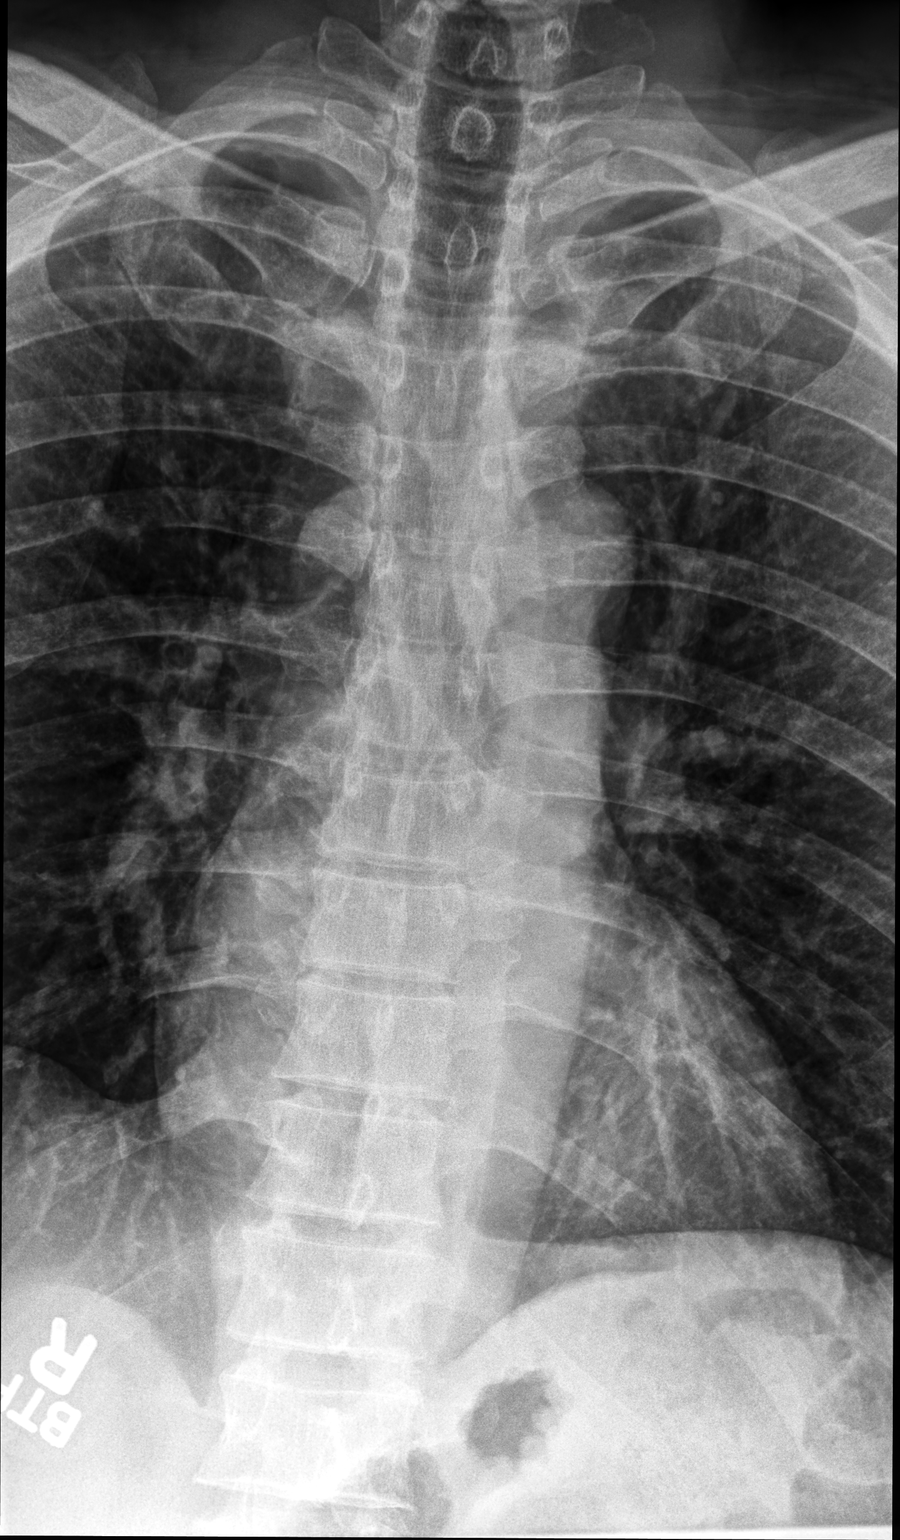

[thoracic spine lat (1 of 2)]
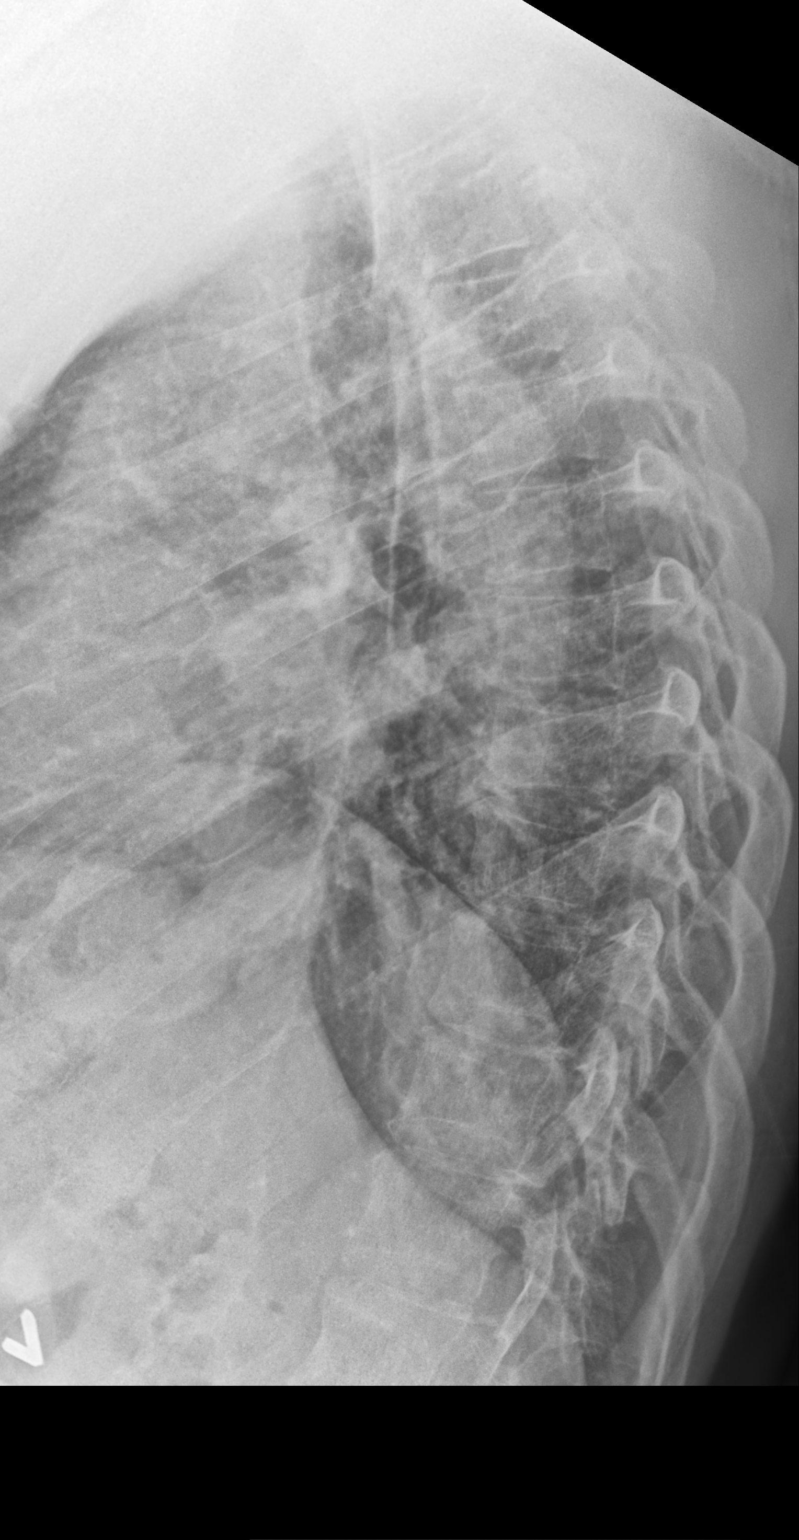

[thoracic spine lat (2 of 2)]
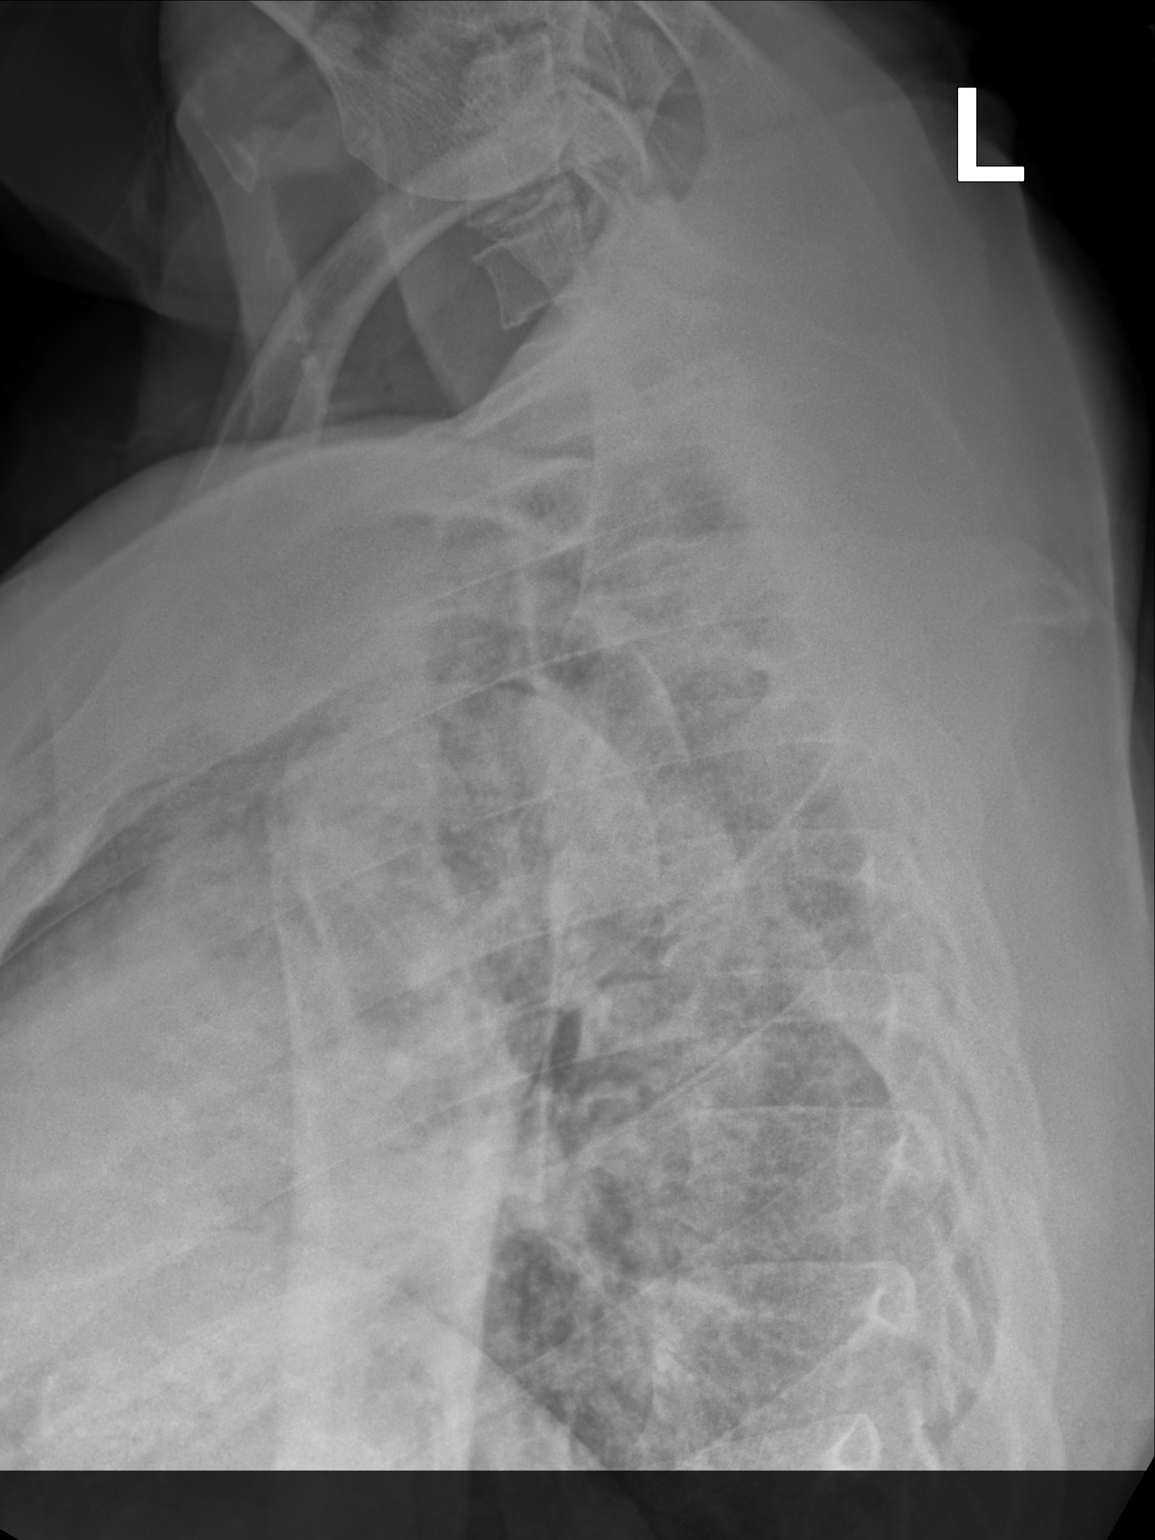

[3 of 3 positions shown; findings below may reference images not displayed]

FINDINGS: The thoracic vertebral bodies are preserved in height. The pedicles
appear appropriately position. There is no abnormal paravertebral
soft tissue density. The disc space heights are reasonably
well-maintained. The mediastinum does not appear abnormally widened.
IMPRESSION: No acute bony abnormality of the thoracic spine is observed. Given
the mechanism of injury, CT scanning of the chest is recommended if
the patient is having thoracic symptoms that might indicate occult
visceral injury.

## 2019-06-27 ENCOUNTER — Encounter: Payer: Self-pay | Admitting: Family Medicine

## 2019-06-27 ENCOUNTER — Ambulatory Visit: Payer: BC Managed Care – PPO | Admitting: Family Medicine

## 2019-06-27 ENCOUNTER — Other Ambulatory Visit: Payer: Self-pay

## 2019-06-27 VITALS — BP 110/80 | HR 87 | Temp 98.6°F | Ht 70.5 in | Wt 177.4 lb

## 2019-06-27 DIAGNOSIS — I10 Essential (primary) hypertension: Secondary | ICD-10-CM

## 2019-06-27 DIAGNOSIS — F5101 Primary insomnia: Secondary | ICD-10-CM

## 2019-06-27 MED ORDER — ZOLPIDEM TARTRATE 10 MG PO TABS: 20.0000 mg | ORAL_TABLET | Freq: Every day | ORAL | 5 refills | Status: DC

## 2019-06-27 NOTE — Progress Notes (Signed)
   Subjective:    Patient ID: [name], male    DOB: 07-Mar-1961, 58 y.o.   MRN: [id number]  HPI Here to follow up on insomnia. He sleeps well with Zolpidem and he wants to continue. He often gets by with taking 1.5 tabs at bedtime.    Review of Systems  Constitutional: Negative.   Respiratory: Negative.   Cardiovascular: Negative.   Neurological: Negative.   Psychiatric/Behavioral: Negative.        Objective:   Physical Exam Constitutional:      Appearance: Normal appearance.  Cardiovascular:     Rate and Rhythm: Normal rate and regular rhythm.     Pulses: Normal pulses.     Heart sounds: Normal heart sounds.  Pulmonary:     Effort: Pulmonary effort is normal.     Breath sounds: Normal breath sounds.  Neurological:     General: No focal deficit present.     Mental Status: He is alert and oriented to person, place, and time.           Assessment & Plan:  Insomnia, refilled Zolpidem. He will set up a well exam soon. Alysia Penna, MD

## 2019-07-16 ENCOUNTER — Encounter: Payer: Self-pay | Admitting: Gastroenterology

## 2019-07-16 ENCOUNTER — Other Ambulatory Visit: Payer: Self-pay

## 2019-07-16 ENCOUNTER — Encounter: Payer: Self-pay | Admitting: Family Medicine

## 2019-07-16 ENCOUNTER — Ambulatory Visit (INDEPENDENT_AMBULATORY_CARE_PROVIDER_SITE_OTHER): Payer: BC Managed Care – PPO | Admitting: Family Medicine

## 2019-07-16 VITALS — BP 118/88 | HR 72 | Temp 98.2°F | Ht 67.5 in | Wt 177.0 lb

## 2019-07-16 DIAGNOSIS — Z23 Encounter for immunization: Secondary | ICD-10-CM | POA: Diagnosis not present

## 2019-07-16 DIAGNOSIS — Z0000 Encounter for general adult medical examination without abnormal findings: Secondary | ICD-10-CM

## 2019-07-16 DIAGNOSIS — D751 Secondary polycythemia: Secondary | ICD-10-CM | POA: Diagnosis not present

## 2019-07-16 DIAGNOSIS — Z1211 Encounter for screening for malignant neoplasm of colon: Secondary | ICD-10-CM | POA: Diagnosis not present

## 2019-07-16 LAB — PSA: PSA: 0.71 ng/mL (ref 0.10–4.00)

## 2019-07-16 LAB — CBC WITH DIFFERENTIAL/PLATELET
Basophils Absolute: 0.1 10*3/uL (ref 0.0–0.1)
Basophils Relative: 0.9 % (ref 0.0–3.0)
Eosinophils Absolute: 0.1 10*3/uL (ref 0.0–0.7)
Eosinophils Relative: 1.5 % (ref 0.0–5.0)
HCT: 57.7 % — ABNORMAL HIGH (ref 39.0–52.0)
Hemoglobin: 19.4 g/dL (ref 13.0–17.0)
Lymphocytes Relative: 18.5 % (ref 12.0–46.0)
Lymphs Abs: 1.1 10*3/uL (ref 0.7–4.0)
MCHC: 33.5 g/dL (ref 30.0–36.0)
MCV: 95.1 fl (ref 78.0–100.0)
Monocytes Absolute: 0.8 10*3/uL (ref 0.1–1.0)
Monocytes Relative: 14.3 % — ABNORMAL HIGH (ref 3.0–12.0)
Neutro Abs: 3.8 10*3/uL (ref 1.4–7.7)
Neutrophils Relative %: 64.8 % (ref 43.0–77.0)
Platelets: 170 10*3/uL (ref 150.0–400.0)
RBC: 6.07 Mil/uL — ABNORMAL HIGH (ref 4.22–5.81)
RDW: 14 % (ref 11.5–15.5)
WBC: 5.9 10*3/uL (ref 4.0–10.5)

## 2019-07-16 LAB — POC URINALSYSI DIPSTICK (AUTOMATED)
Glucose, UA: NEGATIVE
Leukocytes, UA: NEGATIVE
Protein, UA: POSITIVE — AB
Spec Grav, UA: 1.015 (ref 1.010–1.025)
Urobilinogen, UA: 0.2 E.U./dL
pH, UA: 6.5 (ref 5.0–8.0)

## 2019-07-16 LAB — BASIC METABOLIC PANEL
BUN: 11 mg/dL (ref 6–23)
CO2: 25 mEq/L (ref 19–32)
Calcium: 9.4 mg/dL (ref 8.4–10.5)
Chloride: 101 mEq/L (ref 96–112)
Creatinine, Ser: 0.99 mg/dL (ref 0.40–1.50)
GFR: 78.18 mL/min (ref >60.00)
Glucose, Bld: 80 mg/dL (ref 70–99)
Potassium: 4.5 mEq/L (ref 3.5–5.1)
Sodium: 136 mEq/L (ref 135–145)

## 2019-07-16 LAB — HEPATIC FUNCTION PANEL
ALT: 38 U/L (ref 0–53)
AST: 31 U/L (ref 0–37)
Albumin: 4.3 g/dL (ref 3.5–5.2)
Alkaline Phosphatase: 53 U/L (ref 39–117)
Bilirubin, Direct: 0.2 mg/dL (ref 0.0–0.3)
Total Bilirubin: 1.5 mg/dL — ABNORMAL HIGH (ref 0.2–1.2)
Total Protein: 6.6 g/dL (ref 6.0–8.3)

## 2019-07-16 LAB — LIPID PANEL
Cholesterol: 213 mg/dL — ABNORMAL HIGH (ref 0–200)
HDL: 30.1 mg/dL — ABNORMAL LOW (ref >39.00)
LDL Cholesterol: 158 mg/dL — ABNORMAL HIGH (ref 0–99)
NonHDL: 182.72
Total CHOL/HDL Ratio: 7
Triglycerides: 123 mg/dL (ref 0.0–149.0)
VLDL: 24.6 mg/dL (ref 0.0–40.0)

## 2019-07-16 LAB — TSH: TSH: 1.98 u[IU]/mL (ref 0.35–4.50)

## 2019-07-16 NOTE — Progress Notes (Signed)
Subjective:    Patient ID: [name], male    DOB: 1960-10-04, 58 y.o.   MRN: [id number]  HPI Here for a well exam. His only concern is 2 weeks of pain in the right lateral hip. No recent trauma. Using heat.    Review of Systems  Constitutional: Negative.   HENT: Negative.   Eyes: Negative.   Respiratory: Negative.   Cardiovascular: Negative.   Gastrointestinal: Negative.   Genitourinary: Negative.   Musculoskeletal: Positive for arthralgias.  Skin: Negative.   Neurological: Negative.   Psychiatric/Behavioral: Negative.        Objective:   Physical Exam Constitutional:      General: He is not in acute distress.    Appearance: He is well-developed. He is not diaphoretic.  HENT:     Head: Normocephalic and atraumatic.     Right Ear: External ear normal.     Left Ear: External ear normal.     Nose: Nose normal.     Mouth/Throat:     Pharynx: No oropharyngeal exudate.  Eyes:     General: No scleral icterus.       Right eye: No discharge.        Left eye: No discharge.     Conjunctiva/sclera: Conjunctivae normal.     Pupils: Pupils are equal, round, and reactive to light.  Neck:     Musculoskeletal: Neck supple.     Thyroid: No thyromegaly.     Vascular: No JVD.     Trachea: No tracheal deviation.  Cardiovascular:     Rate and Rhythm: Normal rate and regular rhythm.     Heart sounds: Normal heart sounds. No murmur. No friction rub. No gallop.   Pulmonary:     Effort: Pulmonary effort is normal. No respiratory distress.     Breath sounds: Normal breath sounds. No wheezing or rales.  Chest:     Chest wall: No tenderness.  Abdominal:     General: Bowel sounds are normal. There is no distension.     Palpations: Abdomen is soft. There is no mass.     Tenderness: There is no abdominal tenderness. There is no guarding or rebound.  Genitourinary:    Penis: Normal. No tenderness.      Prostate: Normal.     Rectum: Normal. Guaiac result negative.   Musculoskeletal: Normal range of motion.     Comments: He is tender over the right greater trochanter, hip has full ROM   Lymphadenopathy:     Cervical: No cervical adenopathy.  Skin:    General: Skin is warm and dry.     Coloration: Skin is not pale.     Findings: No erythema or rash.  Neurological:     Mental Status: He is alert and oriented to person, place, and time.     Cranial Nerves: No cranial nerve deficit.     Motor: No abnormal muscle tone.     Coordination: Coordination normal.     Deep Tendon Reflexes: Reflexes are normal and symmetric. Reflexes normal.  Psychiatric:        Behavior: Behavior normal.        Thought Content: Thought content normal.        Judgment: Judgment normal.           Assessment & Plan:  Well exam. We discussed diet and exercise. Get fasting labs. Set up a colonoscopy. He has trochanteric bursitis. Advised him to switch to ice packs and take Ibuprofen as needed.  Alysia Penna, MD

## 2019-07-16 NOTE — Addendum Note (Signed)
Addended by: Alysia Penna A on: 07/16/2019 04:53 PM   Modules accepted: Orders

## 2019-07-23 ENCOUNTER — Telehealth: Payer: Self-pay | Admitting: Hematology

## 2019-07-23 NOTE — Telephone Encounter (Signed)
Received a new hem referral from Dr. Sarajane Jews for polycythemia. [name] has been cld and scheduled to see Dr. Irene Limbo on 11/10 at 1pm. Pt aware to arrive 15 minutes early.

## 2019-08-06 ENCOUNTER — Telehealth: Payer: Self-pay | Admitting: *Deleted

## 2019-08-06 NOTE — Telephone Encounter (Signed)
Pt no showed an 830 am PV  Called pt at 845 am- Left Message to return call to RS PV by 5 pm today or colon would be canceled and he could CB to RS both Appts  506 pm- pt did not call to RS PV- Cancelled colon and PV as scheduled- Mailed pt a NS letter   Lelan Pons PV

## 2019-08-12 ENCOUNTER — Inpatient Hospital Stay: Payer: BC Managed Care – PPO

## 2019-08-12 ENCOUNTER — Inpatient Hospital Stay: Payer: BC Managed Care – PPO | Attending: Hematology | Admitting: Hematology

## 2019-08-12 ENCOUNTER — Other Ambulatory Visit: Payer: Self-pay

## 2019-08-12 VITALS — BP 137/85 | HR 71 | Temp 98.5°F | Resp 18 | Ht 67.5 in | Wt 178.1 lb

## 2019-08-12 DIAGNOSIS — I10 Essential (primary) hypertension: Secondary | ICD-10-CM | POA: Insufficient documentation

## 2019-08-12 DIAGNOSIS — Z87442 Personal history of urinary calculi: Secondary | ICD-10-CM | POA: Diagnosis not present

## 2019-08-12 DIAGNOSIS — E785 Hyperlipidemia, unspecified: Secondary | ICD-10-CM | POA: Insufficient documentation

## 2019-08-12 DIAGNOSIS — Z7982 Long term (current) use of aspirin: Secondary | ICD-10-CM | POA: Insufficient documentation

## 2019-08-12 DIAGNOSIS — D751 Secondary polycythemia: Secondary | ICD-10-CM

## 2019-08-12 DIAGNOSIS — G4730 Sleep apnea, unspecified: Secondary | ICD-10-CM | POA: Insufficient documentation

## 2019-08-12 LAB — CMP (CANCER CENTER ONLY)
ALT: 29 U/L (ref 0–44)
AST: 21 U/L (ref 15–41)
Albumin: 3.7 g/dL (ref 3.5–5.0)
Alkaline Phosphatase: 54 U/L (ref 38–126)
Anion gap: 8 (ref 5–15)
BUN: 12 mg/dL (ref 6–20)
CO2: 25 mmol/L (ref 22–32)
Calcium: 8.6 mg/dL — ABNORMAL LOW (ref 8.9–10.3)
Chloride: 106 mmol/L (ref 98–111)
Creatinine: 1 mg/dL (ref 0.61–1.24)
GFR, Est AFR Am: >60 mL/min (ref >60)
GFR, Estimated: >60 mL/min (ref >60)
Glucose, Bld: 97 mg/dL (ref 70–99)
Potassium: 4 mmol/L (ref 3.5–5.1)
Sodium: 139 mmol/L (ref 135–145)
Total Bilirubin: 0.7 mg/dL (ref 0.3–1.2)
Total Protein: 6 g/dL — ABNORMAL LOW (ref 6.5–8.1)

## 2019-08-12 LAB — CBC WITH DIFFERENTIAL/PLATELET
Abs Immature Granulocytes: 0.02 10*3/uL (ref 0.00–0.07)
Basophils Absolute: 0.1 10*3/uL (ref 0.0–0.1)
Basophils Relative: 1 %
Eosinophils Absolute: 0.1 10*3/uL (ref 0.0–0.5)
Eosinophils Relative: 2 %
HCT: 53.8 % — ABNORMAL HIGH (ref 39.0–52.0)
Hemoglobin: 18.4 g/dL — ABNORMAL HIGH (ref 13.0–17.0)
Immature Granulocytes: 0 %
Lymphocytes Relative: 17 %
Lymphs Abs: 1 10*3/uL (ref 0.7–4.0)
MCH: 32.1 pg (ref 26.0–34.0)
MCHC: 34.2 g/dL (ref 30.0–36.0)
MCV: 93.9 fL (ref 80.0–100.0)
Monocytes Absolute: 0.8 10*3/uL (ref 0.1–1.0)
Monocytes Relative: 14 %
Neutro Abs: 3.8 10*3/uL (ref 1.7–7.7)
Neutrophils Relative %: 66 %
Platelets: 150 10*3/uL (ref 150–400)
RBC: 5.73 MIL/uL (ref 4.22–5.81)
RDW: 13.1 % (ref 11.5–15.5)
WBC: 5.8 10*3/uL (ref 4.0–10.5)
nRBC: 0 % (ref 0.0–0.2)

## 2019-08-12 LAB — FERRITIN: Ferritin: 31 ng/mL (ref 24–336)

## 2019-08-12 LAB — IRON AND TIBC
Iron: 61 ug/dL (ref 42–163)
Saturation Ratios: 18 % — ABNORMAL LOW (ref 20–55)
TIBC: 333 ug/dL (ref 202–409)
UIBC: 272 ug/dL (ref 117–376)

## 2019-08-12 MED ORDER — ASPIRIN EC 81 MG PO TBEC: 81.0000 mg | DELAYED_RELEASE_TABLET | Freq: Every day | ORAL | Status: AC

## 2019-08-12 NOTE — Progress Notes (Signed)
HEMATOLOGY/ONCOLOGY CONSULTATION NOTE  Date of Service: 08/12/2019  Patient Care Team: Laurey Morale, MD as PCP - General  CHIEF COMPLAINTS/PURPOSE OF CONSULTATION:  Polycythemia  HISTORY OF PRESENTING ILLNESS:   [name] is a wonderful 58 y.o. male who has been referred to [place] by Dr Sarajane Jews for evaluation and management of Polycythemia. The pt reports that he is doing well overall.   The pt reports that he has not felt any different in the last 3-6 months and has had no new concerns within the last year. Pt was diagnosed with sleep apnea about 15 years ago. He used a CPAP machine for a few years but has not used it in some time. Pt had surgery to fix the deviated septum in his nose and reports improved breathing while sleeping afterwards. He has not had a repeat sleep study since his surgery. Pt does take Ambien as needed to help him sleep, as stress keeps him up sometimes.  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 He denies falling asleep at the wheel when driving and generally feels well rested at night. Pt denies any lung issues such as COPD or Emphysema. Pt does experience seasonal allergies. He has had a Bilateral Spine Kyphoplasty/Vertebroplasty-L2 due to a fall. He no longer takes medications for his HTN or HLD as they are now under control. Pt reports that his weight has been steady for years and weight loss was not the catalyst for coming off of his HTN and HLD medications. Pt has never smoked cigarettes and typically has about 3-4 drinks of hard liquor per day, more on the weekends. He feels that he is able to deny alcoholic beverages when necessary. He notes that he may not be drinking enough water on a regular basis and is most likely not well hydrated. Pt does drink a lot of caffeinated drinks, almost everything that he drinks is a coffee or diet soda. Pt is not currently taking a daily asprin but has had no issues doing so in the past. He is not  currently taking any Testosterone replacement.   Most recent lab results (07/16/2019) of CBC w/diff and CMP is as follows: all values are WNL except for RBC at 6.07, Hgb at 19.4, HCT at 57.7, Mono Rel at 14.3, Total Bilirubin at 1.5.  On review of systems, pt denies fevers, chills, new bone pain, vision changes, SOB, itching, rashes, leg tingling/numbess, discoloration of lower legs and feet, back pain, abdominal pain, bowel habit changes and any other symptoms.   On PMHx the pt reports HTN, HLD, Seasonal allergies, Bilateral Spine Kyphoplasty/Vertebroplasty-L2. On Social Hx the pt reports he is a non-smoker, 3-4 alcoholic drinks per day - more on weekends   MEDICAL HISTORY:  Past Medical History:  Diagnosis Date   Allergy    History of nephrolithiasis    Hyperlipidemia    Hypertension    Sleep apnea     SURGICAL HISTORY: Past Surgical History:  Procedure Laterality Date   APPENDECTOMY     IRRIGATION AND DEBRIDEMENT ABSCESS Left 12/13/2015   Procedure: IRRIGATION AND DEBRIDEMENT LEFT GLUTEAL HEMATOMA;  Surgeon: Leighton Ruff, MD;  Location: [facility];  Service: General;  Laterality: Left;   LUMBAR LAMINECTOMY     UVULECTOMY    Bilateral Spine Kyphoplasty/Vertebroplasty-L2  SOCIAL HISTORY: Social History   Socioeconomic History   Marital status: Married    Spouse name: Not on file   Number of children: Not on file  Years of education: Not on file   Highest education level: Not on file  Occupational History   Not on file  Social Needs   Financial resource strain: Not on file   Food insecurity    Worry: Not on file    Inability: Not on file   Transportation needs    Medical: Not on file    Non-medical: Not on file  Tobacco Use   Smoking status: Never Smoker   Smokeless tobacco: Never Used  Substance and Sexual Activity   Alcohol use: Yes    Alcohol/week: 0.0 standard drinks    Comment: occ   Drug use: No   Sexual activity: Not on file    Lifestyle   Physical activity    Days per week: Not on file    Minutes per session: Not on file   Stress: Not on file  Relationships   Social connections    Talks on phone: Not on file    Gets together: Not on file    Attends religious service: Not on file    Active member of club or organization: Not on file    Attends meetings of clubs or organizations: Not on file    Relationship status: Not on file   Intimate partner violence    Fear of current or ex partner: Not on file    Emotionally abused: Not on file    Physically abused: Not on file    Forced sexual activity: Not on file  Other Topics Concern   Not on file  Social History Narrative   Not on file    FAMILY HISTORY: Family History  Problem Relation Age of Onset   Emphysema Mother    Asthma Mother    Heart disease Father    Hyperlipidemia Other        family  hx   Hypertension Other        family hx   Stroke Other        family hx   Sudden death Other        family hx    ALLERGIES:  is allergic to hydrocodone and zetia [ezetimibe].  MEDICATIONS:  Current Outpatient Medications  Medication Sig Dispense Refill   aspirin EC 81 MG tablet Take 1 tablet (81 mg total) by mouth daily. 30 tablet    zolpidem (AMBIEN) 10 MG tablet Take 2 tablets (20 mg total) by mouth at bedtime. TAKE 2 TABLET BY MOUTH AT BEDTIME AS NEEDED 60 tablet 5   No current facility-administered medications for this visit.     REVIEW OF SYSTEMS:    10 Point review of Systems was done is negative except as noted above.  PHYSICAL EXAMINATION: ECOG PERFORMANCE STATUS: 0 - Asymptomatic  . Vitals:   08/12/19 1306  BP: 137/85  Pulse: 71  Resp: 18  Temp: 98.5 F (36.9 C)  SpO2: 99%   Filed Weights   08/12/19 1306  Weight: 178 lb 1.6 oz (80.8 kg)   .Body mass index is 27.48 kg/m.  GENERAL:alert, in no acute distress and comfortable SKIN: no acute rashes, no significant lesions EYES: conjunctiva are pink and  non-injected, sclera anicteric OROPHARYNX: MMM, no exudates, no oropharyngeal erythema or ulceration NECK: supple, no JVD LYMPH:  no palpable lymphadenopathy in the cervical, axillary or inguinal regions LUNGS: clear to auscultation b/l with normal respiratory effort HEART: regular rate & rhythm ABDOMEN:  normoactive bowel sounds , non tender, not distended. Extremity: no pedal edema PSYCH: alert & oriented x  3 with fluent speech NEURO: no focal motor/sensory deficits  LABORATORY DATA:  I have reviewed the data as listed  . CBC Latest Ref Rng & Units 07/16/2019 12/15/2015 12/14/2015  WBC 4.0 - 10.5 K/uL 5.9 13.9(H) 19.6(H)  Hemoglobin 13.0 - 17.0 g/dL 19.4 Repeated and verified X2.([place]) 12.3(L) 12.8(L)  Hematocrit 39.0 - 52.0 % 57.7 Repeated and verified X2.(H) 36.1(L) 36.8(L)  Platelets 150.0 - 400.0 K/uL 170.0 304 276    . CMP Latest Ref Rng & Units 07/16/2019 12/15/2015 12/13/2015  Glucose 70 - 99 mg/dL 80 84 104(H)  BUN 6 - 23 mg/dL 11 14 17   Creatinine 0.40 - 1.50 mg/dL 0.99 1.06 0.88  Sodium 135 - 145 mEq/L 136 138 132(L)  Potassium 3.5 - 5.1 mEq/L 4.5 4.3 4.5  Chloride 96 - 112 mEq/L 101 103 101  CO2 19 - 32 mEq/L 25 27 22   Calcium 8.4 - 10.5 mg/dL 9.4 7.4(L) 8.3(L)  Total Protein 6.0 - 8.3 g/dL 6.6 - 5.9(L)  Total Bilirubin 0.2 - 1.2 mg/dL 1.5(H) - 1.9(H)  Alkaline Phos 39 - 117 U/L 53 - 76  AST 0 - 37 U/L 31 - 51(H)  ALT 0 - 53 U/L 38 - 55   Component     Latest Ref Rng & Units 12/13/2015 12/14/2015 12/15/2015  RBC     4.22 - 5.81 Mil/uL 4.67 4.23 4.16 (L)  Hemoglobin     13.0 - 17.0 g/dL 14.1 12.8 (L) 12.3 (L)  HCT     39.0 - 52.0 % 41.8 36.8 (L) 36.1 (L)   Component     Latest Ref Rng & Units 07/16/2019  RBC     4.22 - 5.81 Mil/uL 6.07 (H)  Hemoglobin     13.0 - 17.0 g/dL 19.4 Repeated and verified X2. (HH)  HCT     39.0 - 52.0 % 57.7 Repeated and verified X2. (H)    RADIOGRAPHIC STUDIES: I have personally reviewed the radiological images as listed and  agreed with the findings in the report. No results found.  ASSESSMENT & PLAN:   58 yo with   1) Polycythemia  PCV vs secondary polycythemia PLAN: -Discussed patient's most recent labs from 07/16/2019,  all values are WNL except for RBC at 6.07, Hgb at 19.4, HCT at 57.7, Mono Rel at 14.3, Total Bilirubin at 1.5. -Discussed elevated RBC could be due to a reactive process or primary bone marrow problem such as: Polycythemia Vera  -Discussed the increased risk of blood clots associated with PV -Advised that a primary bone marrow problem carries at 10% risk of developing into cancer every year  -Recommend 81 mg baby asprin daily  -Recommended that the pt drink at least 48-64 oz of water each day  -Will test for JAK2 mutation to r/o primary bone marrow problem -Will get labs today  -Will see back in 3 weeks via phone   FOLLOW UP: -Labs today -Phone visit in 3 weeks  All of the patients questions were answered with apparent satisfaction. The patient knows to call the clinic with any problems, questions or concerns.  I spent 61mins  counseling the patient face to face. The total time spent in the appointment was 45 minutes and more than 50% was on counseling and direct patient cares.    Sullivan Lone MD [place] AAHIVMS [facility] Hematology/Oncology Physician [facility]  (Office):       [phone] (Work cell):  [phone] (Fax):  [phone]  08/12/2019 1:58 PM  I, Yevette Edwards, am acting as a Education administrator for Dr. Sullivan Lone.   .I have reviewed the above documentation for accuracy and completeness, and I agree with the above. Brunetta Genera MD

## 2019-08-13 ENCOUNTER — Telehealth: Payer: Self-pay | Admitting: Hematology

## 2019-08-13 LAB — ERYTHROPOIETIN: Erythropoietin: 11.4 m[IU]/mL (ref 2.6–18.5)

## 2019-08-13 NOTE — Telephone Encounter (Signed)
Scheduled per los. Called and spoke with patient. Confirmed appt 

## 2019-08-20 ENCOUNTER — Encounter: Payer: BC Managed Care – PPO | Admitting: Gastroenterology

## 2019-08-27 LAB — JAK2 (INCLUDING V617F AND EXON 12), MPL,& CALR-NEXT GEN SEQ

## 2019-09-10 ENCOUNTER — Inpatient Hospital Stay: Payer: Self-pay | Attending: Hematology | Admitting: Hematology

## 2019-09-10 DIAGNOSIS — D751 Secondary polycythemia: Secondary | ICD-10-CM

## 2019-09-10 NOTE — Progress Notes (Signed)
HEMATOLOGY/ONCOLOGY CONSULTATION NOTE  Date of Service: 09/10/2019  Patient Care Team: Laurey Morale, MD as PCP - General  CHIEF COMPLAINTS/PURPOSE OF CONSULTATION:  Polycythemia  HISTORY OF PRESENTING ILLNESS:   [name] is a wonderful 58 y.o. male who has been referred to [place] by Dr Sarajane Jews for evaluation and management of Polycythemia. The pt reports that he is doing well overall.   The pt reports that he has not felt any different in the last 3-6 months and has had no new concerns within the last year. Pt was diagnosed with sleep apnea about 15 years ago. He used a CPAP machine for a few years but has not used it in some time. Pt had surgery to fix the deviated septum in his nose and reports improved breathing while sleeping afterwards. He has not had a repeat sleep study since his surgery. Pt does take Ambien as needed to help him sleep, as stress keeps him up sometimes.  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 He denies falling asleep at the wheel when driving and generally feels well rested at night. Pt denies any lung issues such as COPD or Emphysema. Pt does experience seasonal allergies. He has had a Bilateral Spine Kyphoplasty/Vertebroplasty-L2 due to a fall. He no longer takes medications for his HTN or HLD as they are now under control. Pt reports that his weight has been steady for years and weight loss was not the catalyst for coming off of his HTN and HLD medications. Pt has never smoked cigarettes and typically has about 3-4 drinks of hard liquor per day, more on the weekends. He feels that he is able to deny alcoholic beverages when necessary. He notes that he may not be drinking enough water on a regular basis and is most likely not well hydrated. Pt does drink a lot of caffeinated drinks, almost everything that he drinks is a coffee or diet soda. Pt is not currently taking a daily asprin but has had no issues doing so in the past. He is not  currently taking any Testosterone replacement.   Most recent lab results (07/16/2019) of CBC w/diff and CMP is as follows: all values are WNL except for RBC at 6.07, Hgb at 19.4, HCT at 57.7, Mono Rel at 14.3, Total Bilirubin at 1.5.  On review of systems, pt denies fevers, chills, new bone pain, vision changes, SOB, itching, rashes, leg tingling/numbess, discoloration of lower legs and feet, back pain, abdominal pain, bowel habit changes and any other symptoms.   On PMHx the pt reports HTN, HLD, Seasonal allergies, Bilateral Spine Kyphoplasty/Vertebroplasty-L2. On Social Hx the pt reports he is a non-smoker, 3-4 alcoholic drinks per day - more on weekends  INTERVAL HISTORY:   I connected with  [name] on 09/10/19 by a video enabled telemedicine application and verified that I am speaking with the correct person using two identifiers.   I discussed the limitations of evaluation and management by telemedicine. The patient expressed understanding and agreed to proceed.  Other persons participating in the visit and their role in the encounter:     -Yevette Edwards, Medical Scribe  Patient's location: Home Provider's location: [facility] [place] is a wonderful 58 y.o. male who is here for evaluation and management of Polycythemia. The patient's last visit with [place] was on 08/12/2019. The pt reports that he is doing well overall.  The pt reports that  he has never been a smoker and is currently working on drinking more water. Pt was trying to donate blood 3 weeks ago but they would not allow him to do so because his Hgb level was around 20. He has no other concerns at this time.  08/12/2019 JAK2 mutation testing revealed "No Mutations Identified"  Lab results (08/12/19) of CBC w/diff and CMP is as follows: all values are WNL except for Hgb at 18.4, HCT at 53.8, Calcium at 8.6, Total Protein at 6.0. 08/12/2019 Erythropoietin at 11.4 08/12/2019  Ferritin at 31 08/12/2019 Iron and TIBC is as follows: Iron at 61, TIBC at 333, Sat Ratios at 18, UIBC at 272  On review of systems, pt denies any other symptoms.   MEDICAL HISTORY:  Past Medical History:  Diagnosis Date   Allergy    History of nephrolithiasis    Hyperlipidemia    Hypertension    Sleep apnea     SURGICAL HISTORY: Past Surgical History:  Procedure Laterality Date   APPENDECTOMY     IRRIGATION AND DEBRIDEMENT ABSCESS Left 12/13/2015   Procedure: IRRIGATION AND DEBRIDEMENT LEFT GLUTEAL HEMATOMA;  Surgeon: Leighton Ruff, MD;  Location: [facility];  Service: General;  Laterality: Left;   LUMBAR LAMINECTOMY     UVULECTOMY    Bilateral Spine Kyphoplasty/Vertebroplasty-L2  SOCIAL HISTORY: Social History   Socioeconomic History   Marital status: Married    Spouse name: Not on file   Number of children: Not on file   Years of education: Not on file   Highest education level: Not on file  Occupational History   Not on file  Social Needs   Financial resource strain: Not on file   Food insecurity    Worry: Not on file    Inability: Not on file   Transportation needs    Medical: Not on file    Non-medical: Not on file  Tobacco Use   Smoking status: Never Smoker   Smokeless tobacco: Never Used  Substance and Sexual Activity   Alcohol use: Yes    Alcohol/week: 0.0 standard drinks    Comment: occ   Drug use: No   Sexual activity: Not on file  Lifestyle   Physical activity    Days per week: Not on file    Minutes per session: Not on file   Stress: Not on file  Relationships   Social connections    Talks on phone: Not on file    Gets together: Not on file    Attends religious service: Not on file    Active member of club or organization: Not on file    Attends meetings of clubs or organizations: Not on file    Relationship status: Not on file   Intimate partner violence    Fear of current or ex partner: Not on file    Emotionally  abused: Not on file    Physically abused: Not on file    Forced sexual activity: Not on file  Other Topics Concern   Not on file  Social History Narrative   Not on file    FAMILY HISTORY: Family History  Problem Relation Age of Onset   Emphysema Mother    Asthma Mother    Heart disease Father    Hyperlipidemia Other        family  hx   Hypertension Other        family hx   Stroke Other        family hx  Sudden death Other        family hx    ALLERGIES:  is allergic to hydrocodone and zetia [ezetimibe].  MEDICATIONS:  Current Outpatient Medications  Medication Sig Dispense Refill   aspirin EC 81 MG tablet Take 1 tablet (81 mg total) by mouth daily. 30 tablet    zolpidem (AMBIEN) 10 MG tablet Take 2 tablets (20 mg total) by mouth at bedtime. TAKE 2 TABLET BY MOUTH AT BEDTIME AS NEEDED 60 tablet 5   No current facility-administered medications for this visit.     REVIEW OF SYSTEMS:   A 10+ POINT REVIEW OF SYSTEMS WAS OBTAINED including neurology, dermatology, psychiatry, cardiac, respiratory, lymph, extremities, GI, GU, Musculoskeletal, constitutional, breasts, reproductive, HEENT.  All pertinent positives are noted in the HPI.  All others are negative.   PHYSICAL EXAMINATION: ECOG PERFORMANCE STATUS: 0 - Asymptomatic  . There were no vitals filed for this visit. There were no vitals filed for this visit. .There is no height or weight on file to calculate BMI.  Telehealth visit  LABORATORY DATA:  I have reviewed the data as listed  . CBC Latest Ref Rng & Units 08/12/2019 07/16/2019 12/15/2015  WBC 4.0 - 10.5 K/uL 5.8 5.9 13.9(H)  Hemoglobin 13.0 - 17.0 g/dL 18.4(H) 19.4 Repeated and verified X2.(Doctor Phillips) 12.3(L)  Hematocrit 39.0 - 52.0 % 53.8(H) 57.7 Repeated and verified X2.(H) 36.1(L)  Platelets 150 - 400 K/uL 150 170.0 304    . CMP Latest Ref Rng & Units 08/12/2019 07/16/2019 12/15/2015  Glucose 70 - 99 mg/dL 97 80 84  BUN 6 - 20 mg/dL 12 11 14     Creatinine 0.61 - 1.24 mg/dL 1.00 0.99 1.06  Sodium 135 - 145 mmol/L 139 136 138  Potassium 3.5 - 5.1 mmol/L 4.0 4.5 4.3  Chloride 98 - 111 mmol/L 106 101 103  CO2 22 - 32 mmol/L 25 25 27   Calcium 8.9 - 10.3 mg/dL 8.6(L) 9.4 7.4(L)  Total Protein 6.5 - 8.1 g/dL 6.0(L) 6.6 -  Total Bilirubin 0.3 - 1.2 mg/dL 0.7 1.5(H) -  Alkaline Phos 38 - 126 U/L 54 53 -  AST 15 - 41 U/L 21 31 -  ALT 0 - 44 U/L 29 38 -   Component     Latest Ref Rng & Units 12/13/2015 12/14/2015 12/15/2015  RBC     4.22 - 5.81 Mil/uL 4.67 4.23 4.16 (L)  Hemoglobin     13.0 - 17.0 g/dL 14.1 12.8 (L) 12.3 (L)  HCT     39.0 - 52.0 % 41.8 36.8 (L) 36.1 (L)   Component     Latest Ref Rng & Units 07/16/2019  RBC     4.22 - 5.81 Mil/uL 6.07 (H)  Hemoglobin     13.0 - 17.0 g/dL 19.4 Repeated and verified X2. (HH)  HCT     39.0 - 52.0 % 57.7 Repeated and verified X2. (H)   08/12/2019 JAK2, MPL and CALR Sequencing:    RADIOGRAPHIC STUDIES: I have personally reviewed the radiological images as listed and agreed with the findings in the report. No results found.  ASSESSMENT & PLAN:   58 yo with   1) Polycythemia  likely secondary polycythemia PLAN: -Discussed pt labwork, 08/12/19; all values are WNL except for Hgb at 18.4, HCT at 53.8, Calcium at 8.6, Total Protein at 6.0. -Discussed 08/12/2019 Ferritin at 31 -Discussed 08/12/2019 Erythropoietin at 11.4 -Discussed 08/12/2019 Iron and TIBC is as follows: Iron at 61, TIBC at 333, Sat Ratios at 18, UIBC  at 1 -Discussed that 08/12/2019 JAK2 mutation testing was negative for any mutations -Pt does not appear to have a primary bone marrow problem based on labs  -Discussed common causes for secondary polycythemia including: lung conditions, smoking, dehydration -Recommended pt to minimize alcohol intake, drink at least 48-64 oz of water each day, and get rpt sleep study -Encouraged pt to make changes at home before placing him on a therapeutic phlebotomy  schedule -Recommend Dr. Sarajane Jews get a rpt sleep study -Recommended pt continue to f/u with Dr. Sarajane Jews  -Will see back in 3 months with labs   FOLLOW UP: RTC with Dr Irene Limbo with labs in 3 months   The total time spent in the appt was 15 minutes and more than 50% was on counseling and direct patient cares.  All of the patient's questions were answered with apparent satisfaction. The patient knows to call the clinic with any problems, questions or concerns.    Sullivan Lone MD [place] AAHIVMS [facility] Hematology/Oncology Physician [facility]  (Office):       [phone] (Work cell):  [phone] (Fax):           [phone]  09/10/2019 9:29 AM  I, Yevette Edwards, am acting as a scribe for Dr. Sullivan Lone.   .I have reviewed the above documentation for accuracy and completeness, and I agree with the above. Brunetta Genera MD

## 2019-09-11 ENCOUNTER — Telehealth: Payer: Self-pay | Admitting: Hematology

## 2019-09-11 NOTE — Telephone Encounter (Signed)
Scheduled appt per 12/9 los.  Spoke with pt and they are aware of the appt date and time.

## 2019-10-28 ENCOUNTER — Other Ambulatory Visit: Payer: Self-pay | Admitting: Family Medicine

## 2019-10-29 NOTE — Telephone Encounter (Signed)
Please advise Rx is not on current med list. 

## 2019-11-24 DIAGNOSIS — K219 Gastro-esophageal reflux disease without esophagitis: Secondary | ICD-10-CM | POA: Diagnosis not present

## 2019-11-24 DIAGNOSIS — J324 Chronic pansinusitis: Secondary | ICD-10-CM | POA: Diagnosis not present

## 2019-12-08 DIAGNOSIS — J329 Chronic sinusitis, unspecified: Secondary | ICD-10-CM | POA: Diagnosis not present

## 2019-12-08 DIAGNOSIS — K219 Gastro-esophageal reflux disease without esophagitis: Secondary | ICD-10-CM | POA: Diagnosis not present

## 2019-12-08 DIAGNOSIS — J324 Chronic pansinusitis: Secondary | ICD-10-CM | POA: Diagnosis not present

## 2019-12-09 ENCOUNTER — Inpatient Hospital Stay: Payer: BC Managed Care – PPO | Attending: Hematology

## 2019-12-09 ENCOUNTER — Inpatient Hospital Stay (HOSPITAL_BASED_OUTPATIENT_CLINIC_OR_DEPARTMENT_OTHER): Payer: BC Managed Care – PPO | Admitting: Hematology

## 2019-12-09 ENCOUNTER — Other Ambulatory Visit: Payer: Self-pay

## 2019-12-09 VITALS — BP 127/74 | HR 77 | Temp 98.7°F | Resp 17 | Ht 67.5 in | Wt 177.1 lb

## 2019-12-09 DIAGNOSIS — D751 Secondary polycythemia: Secondary | ICD-10-CM

## 2019-12-09 LAB — CMP (CANCER CENTER ONLY)
ALT: 31 U/L (ref 0–44)
AST: 28 U/L (ref 15–41)
Albumin: 3.5 g/dL (ref 3.5–5.0)
Alkaline Phosphatase: 69 U/L (ref 38–126)
Anion gap: 6 (ref 5–15)
BUN: 12 mg/dL (ref 6–20)
CO2: 28 mmol/L (ref 22–32)
Calcium: 8.9 mg/dL (ref 8.9–10.3)
Chloride: 105 mmol/L (ref 98–111)
Creatinine: 1 mg/dL (ref 0.61–1.24)
GFR, Est AFR Am: >60 mL/min (ref >60)
GFR, Estimated: >60 mL/min (ref >60)
Glucose, Bld: 100 mg/dL — ABNORMAL HIGH (ref 70–99)
Potassium: 4.5 mmol/L (ref 3.5–5.1)
Sodium: 139 mmol/L (ref 135–145)
Total Bilirubin: 0.6 mg/dL (ref 0.3–1.2)
Total Protein: 6.3 g/dL — ABNORMAL LOW (ref 6.5–8.1)

## 2019-12-09 LAB — CBC WITH DIFFERENTIAL/PLATELET
Abs Immature Granulocytes: 0.02 10*3/uL (ref 0.00–0.07)
Basophils Absolute: 0.1 10*3/uL (ref 0.0–0.1)
Basophils Relative: 1 %
Eosinophils Absolute: 0.3 10*3/uL (ref 0.0–0.5)
Eosinophils Relative: 4 %
HCT: 54.9 % — ABNORMAL HIGH (ref 39.0–52.0)
Hemoglobin: 18.4 g/dL — ABNORMAL HIGH (ref 13.0–17.0)
Immature Granulocytes: 0 %
Lymphocytes Relative: 14 %
Lymphs Abs: 0.9 10*3/uL (ref 0.7–4.0)
MCH: 31.8 pg (ref 26.0–34.0)
MCHC: 33.5 g/dL (ref 30.0–36.0)
MCV: 94.8 fL (ref 80.0–100.0)
Monocytes Absolute: 0.8 10*3/uL (ref 0.1–1.0)
Monocytes Relative: 12 %
Neutro Abs: 4.5 10*3/uL (ref 1.7–7.7)
Neutrophils Relative %: 69 %
Platelets: 231 10*3/uL (ref 150–400)
RBC: 5.79 MIL/uL (ref 4.22–5.81)
RDW: 12.8 % (ref 11.5–15.5)
WBC: 6.6 10*3/uL (ref 4.0–10.5)
nRBC: 0 % (ref 0.0–0.2)

## 2019-12-09 NOTE — Progress Notes (Signed)
HEMATOLOGY/ONCOLOGY CONSULTATION NOTE  Date of Service: 12/09/2019  Patient Care Team: Laurey Morale, MD as PCP - General  CHIEF COMPLAINTS/PURPOSE OF CONSULTATION:  Polycythemia  HISTORY OF PRESENTING ILLNESS:   [name] is a wonderful 59 y.o. male who has been referred to [place] by Dr Sarajane Jews for evaluation and management of Polycythemia. The pt reports that he is doing well overall.   The pt reports that he has not felt any different in the last 3-6 months and has had no new concerns within the last year. Pt was diagnosed with sleep apnea about 15 years ago. He used a CPAP machine for a few years but has not used it in some time. Pt had surgery to fix the deviated septum in his nose and reports improved breathing while sleeping afterwards. He has not had a repeat sleep study since his surgery. Pt does take Ambien as needed to help him sleep, as stress keeps him up sometimes.  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 He denies falling asleep at the wheel when driving and generally feels well rested at night. Pt denies any lung issues such as COPD or Emphysema. Pt does experience seasonal allergies. He has had a Bilateral Spine Kyphoplasty/Vertebroplasty-L2 due to a fall. He no longer takes medications for his HTN or HLD as they are now under control. Pt reports that his weight has been steady for years and weight loss was not the catalyst for coming off of his HTN and HLD medications. Pt has never smoked cigarettes and typically has about 3-4 drinks of hard liquor per day, more on the weekends. He feels that he is able to deny alcoholic beverages when necessary. He notes that he may not be drinking enough water on a regular basis and is most likely not well hydrated. Pt does drink a lot of caffeinated drinks, almost everything that he drinks is a coffee or diet soda. Pt is not currently taking a daily asprin but has had no issues doing so in the past. He is not  currently taking any Testosterone replacement.   Most recent lab results (07/16/2019) of CBC w/diff and CMP is as follows: all values are WNL except for RBC at 6.07, Hgb at 19.4, HCT at 57.7, Mono Rel at 14.3, Total Bilirubin at 1.5.  On review of systems, pt denies fevers, chills, new bone pain, vision changes, SOB, itching, rashes, leg tingling/numbess, discoloration of lower legs and feet, back pain, abdominal pain, bowel habit changes and any other symptoms.   On PMHx the pt reports HTN, HLD, Seasonal allergies, Bilateral Spine Kyphoplasty/Vertebroplasty-L2. On Social Hx the pt reports he is a non-smoker, 3-4 alcoholic drinks per day - more on weekends  INTERVAL HISTORY:   [name] is a wonderful 59 y.o. male who is here for evaluation and management of Polycythemia. The patient's last visit with [place] was on 09/10/2019. The pt reports that he is doing well overall.  The pt reports that he had a sinus infection in the interim that has resolved. After his sleep study in 2008, which found that he did have sleep apnea, pt had a nasal surgery for a deviated septum. His recent sinus infection has been the first since his surgery in 2008. He has not had a repeat sleep study since then. Pt notes that he has a stressful job and some nights gets better rest than others.   Pt has been drinking nearly a gallon of  water per day, cutting down on sodas, and drinking 2-3 alcoholic beverages a couple of times per week.   Pt would like to receive the COVID19 vaccine, but is waiting on his opportunity to get it.   Lab results today (12/09/19) of CBC w/diff and CMP is as follows: all values are WNL except for Hgb at 18.4, HCT at 54.9, Glucose at 100, Total Protein at 6.3.  On review of systems, pt denies any other symptoms.    MEDICAL HISTORY:  Past Medical History:  Diagnosis Date  . Allergy   . History of nephrolithiasis   . Hyperlipidemia   . Hypertension   . Sleep apnea     SURGICAL  HISTORY: Past Surgical History:  Procedure Laterality Date  . APPENDECTOMY    . IRRIGATION AND DEBRIDEMENT ABSCESS Left 12/13/2015   Procedure: IRRIGATION AND DEBRIDEMENT LEFT GLUTEAL HEMATOMA;  Surgeon: Leighton Ruff, MD;  Location: [facility];  Service: General;  Laterality: Left;  . LUMBAR LAMINECTOMY    . UVULECTOMY    Bilateral Spine Kyphoplasty/Vertebroplasty-L2  SOCIAL HISTORY: Social History   Socioeconomic History  . Marital status: Married    Spouse name: Not on file  . Number of children: Not on file  . Years of education: Not on file  . Highest education level: Not on file  Occupational History  . Not on file  Tobacco Use  . Smoking status: Never Smoker  . Smokeless tobacco: Never Used  Substance and Sexual Activity  . Alcohol use: Yes    Alcohol/week: 0.0 standard drinks    Comment: occ  . Drug use: No  . Sexual activity: Not on file  Other Topics Concern  . Not on file  Social History Narrative  . Not on file   Social Determinants of Health   Financial Resource Strain:   . Difficulty of Paying Living Expenses: Not on file  Food Insecurity:   . Worried About Charity fundraiser in the Last Year: Not on file  . Ran Out of Food in the Last Year: Not on file  Transportation Needs:   . Lack of Transportation (Medical): Not on file  . Lack of Transportation (Non-Medical): Not on file  Physical Activity:   . Days of Exercise per Week: Not on file  . Minutes of Exercise per Session: Not on file  Stress:   . Feeling of Stress : Not on file  Social Connections:   . Frequency of Communication with Friends and Family: Not on file  . Frequency of Social Gatherings with Friends and Family: Not on file  . Attends Religious Services: Not on file  . Active Member of Clubs or Organizations: Not on file  . Attends Archivist Meetings: Not on file  . Marital Status: Not on file  Intimate Partner Violence:   . Fear of Current or Ex-Partner: Not on file  .  Emotionally Abused: Not on file  . Physically Abused: Not on file  . Sexually Abused: Not on file    FAMILY HISTORY: Family History  Problem Relation Age of Onset  . Emphysema Mother   . Asthma Mother   . Heart disease Father   . Hyperlipidemia Other        family  hx  . Hypertension Other        family hx  . Stroke Other        family hx  . Sudden death Other        family hx  ALLERGIES:  is allergic to hydrocodone and zetia [ezetimibe].  MEDICATIONS:  Current Outpatient Medications  Medication Sig Dispense Refill  . aspirin EC 81 MG tablet Take 1 tablet (81 mg total) by mouth daily. 30 tablet   . traZODone (DESYREL) 50 MG tablet TAKE 1 TABLET BY MOUTH EVERYDAY AT BEDTIME 90 tablet 1  . zolpidem (AMBIEN) 10 MG tablet Take 2 tablets (20 mg total) by mouth at bedtime. TAKE 2 TABLET BY MOUTH AT BEDTIME AS NEEDED 60 tablet 5   No current facility-administered medications for this visit.    REVIEW OF SYSTEMS:   A 10+ POINT REVIEW OF SYSTEMS WAS OBTAINED including neurology, dermatology, psychiatry, cardiac, respiratory, lymph, extremities, GI, GU, Musculoskeletal, constitutional, breasts, reproductive, HEENT.  All pertinent positives are noted in the HPI.  All others are negative.   PHYSICAL EXAMINATION: ECOG PERFORMANCE STATUS: 0 - Asymptomatic  . Vitals:   12/09/19 1030  BP: 127/74  Pulse: 77  Resp: 17  Temp: 98.7 F (37.1 C)  SpO2: 98%   Filed Weights   12/09/19 1030  Weight: 177 lb 1.6 oz (80.3 kg)   .Body mass index is 27.33 kg/m.   GENERAL:alert, in no acute distress and comfortable SKIN: no acute rashes, no significant lesions EYES: conjunctiva are pink and non-injected, sclera anicteric OROPHARYNX: MMM, no exudates, no oropharyngeal erythema or ulceration NECK: supple, no JVD LYMPH:  no palpable lymphadenopathy in the cervical, axillary or inguinal regions LUNGS: clear to auscultation b/l with normal respiratory effort HEART: regular rate &  rhythm ABDOMEN:  normoactive bowel sounds , non tender, not distended. No palpable hepatosplenomegaly.  Extremity: no pedal edema PSYCH: alert & oriented x 3 with fluent speech NEURO: no focal motor/sensory deficits  LABORATORY DATA:  I have reviewed the data as listed  . CBC Latest Ref Rng & Units 12/09/2019 08/12/2019 07/16/2019  WBC 4.0 - 10.5 K/uL 6.6 5.8 5.9  Hemoglobin 13.0 - 17.0 g/dL 18.4(H) 18.4(H) 19.4 Repeated and verified X2.(HH)  Hematocrit 39.0 - 52.0 % 54.9(H) 53.8(H) 57.7 Repeated and verified X2.(H)  Platelets 150 - 400 K/uL 231 150 170.0    . CMP Latest Ref Rng & Units 12/09/2019 08/12/2019 07/16/2019  Glucose 70 - 99 mg/dL 100(H) 97 80  BUN 6 - 20 mg/dL 12 12 11   Creatinine 0.61 - 1.24 mg/dL 1.00 1.00 0.99  Sodium 135 - 145 mmol/L 139 139 136  Potassium 3.5 - 5.1 mmol/L 4.5 4.0 4.5  Chloride 98 - 111 mmol/L 105 106 101  CO2 22 - 32 mmol/L 28 25 25   Calcium 8.9 - 10.3 mg/dL 8.9 8.6(L) 9.4  Total Protein 6.5 - 8.1 g/dL 6.3(L) 6.0(L) 6.6  Total Bilirubin 0.3 - 1.2 mg/dL 0.6 0.7 1.5(H)  Alkaline Phos 38 - 126 U/L 69 54 53  AST 15 - 41 U/L 28 21 31   ALT 0 - 44 U/L 31 29 38   Component     Latest Ref Rng & Units 12/13/2015 12/14/2015 12/15/2015  RBC     4.22 - 5.81 Mil/uL 4.67 4.23 4.16 (L)  Hemoglobin     13.0 - 17.0 g/dL 14.1 12.8 (L) 12.3 (L)  HCT     39.0 - 52.0 % 41.8 36.8 (L) 36.1 (L)   Component     Latest Ref Rng & Units 07/16/2019  RBC     4.22 - 5.81 Mil/uL 6.07 (H)  Hemoglobin     13.0 - 17.0 g/dL 19.4 Repeated and verified X2. (HH)  HCT  39.0 - 52.0 % 57.7 Repeated and verified X2. (H)   08/12/2019 JAK2, MPL and CALR Sequencing:    RADIOGRAPHIC STUDIES: I have personally reviewed the radiological images as listed and agreed with the findings in the report. No results found.  ASSESSMENT & PLAN:   59 yo with   1) Polycythemia  likely secondary polycythemia PLAN: -Discussed pt labwork today, 12/09/19; Hgb and HCT still elevated, other  blood counts and blood chemistries looking good  -Discussed 08/12/2019 Erythropoietin at 11.4. which supports that pt has secondary polycythemia -Advised pt that due to negative JAK2 mutations he has a less than 1% chance of having PCV -Recommend pt receive the COVID19 vaccine when available -Recommended that the pt continue to eat well, drink at least 48-64 oz of water each day, and walk 20-30 minutes each day. -Advised pt that he could choose to give blood a few times per year to help with HCT  -Recommend pt continue to f/u with Dr. Sarajane Jews for repeat sleep study and PFT 's to evaluate for causes of secondary polycythemia -Will see back as needed  FOLLOW UP: RTC with Dr Irene Limbo as needed   The total time spent in the appt was 20 minutes and more than 50% was on counseling and direct patient cares.  All of the patient's questions were answered with apparent satisfaction. The patient knows to call the clinic with any problems, questions or concerns.    Sullivan Lone MD [place] AAHIVMS [facility] Hematology/Oncology Physician [facility]  (Office):       [phone] (Work cell):  [phone] (Fax):           [phone]  12/09/2019 11:19 AM  I, Yevette Edwards, am acting as a scribe for Dr. Sullivan Lone.   .I have reviewed the above documentation for accuracy and completeness, and I agree with the above. Brunetta Genera MD

## 2019-12-25 ENCOUNTER — Other Ambulatory Visit: Payer: Self-pay | Admitting: Family Medicine

## 2019-12-27 NOTE — Telephone Encounter (Signed)
Last ov:08/12/2019 Last filled:06/27/2019

## 2020-01-05 ENCOUNTER — Other Ambulatory Visit: Payer: Self-pay

## 2020-01-06 ENCOUNTER — Encounter: Payer: Self-pay | Admitting: Family Medicine

## 2020-01-06 ENCOUNTER — Ambulatory Visit (INDEPENDENT_AMBULATORY_CARE_PROVIDER_SITE_OTHER): Payer: BC Managed Care – PPO | Admitting: Family Medicine

## 2020-01-06 VITALS — BP 120/68 | HR 87 | Temp 98.2°F | Wt 174.6 lb

## 2020-01-06 DIAGNOSIS — T1592XA Foreign body on external eye, part unspecified, left eye, initial encounter: Secondary | ICD-10-CM | POA: Diagnosis not present

## 2020-01-06 MED ORDER — TOBRAMYCIN 0.3 % OP SOLN: 2.0000 [drp] | OPHTHALMIC | 0 refills | Status: DC

## 2020-01-06 NOTE — Progress Notes (Signed)
   Subjective:    Patient ID: [name], male    DOB: 11-Nov-1960, 59 y.o.   MRN: [id number]  HPI Here for pain in the left eye after a piece of debris flew into it 3 days ago. He was grinding a piece of metal without goggles at the time. His vision is slightly blurry due to tearing but is otherwise intact. He does not wear contacts.    Review of Systems  Constitutional: Negative.   HENT: Negative.   Eyes: Positive for pain and visual disturbance. Negative for photophobia, discharge, redness and itching.  Respiratory: Negative.   Cardiovascular: Negative.        Objective:   Physical Exam Constitutional:      General: He is not in acute distress.    Appearance: Normal appearance.  Eyes:     Extraocular Movements: Extraocular movements intact.     Conjunctiva/sclera: Conjunctivae normal.     Pupils: Pupils are equal, round, and reactive to light.     Comments: A tiny piece of material is seen on the left cornea at the 7 o'clock position   Neurological:     Mental Status: He is alert.           Assessment & Plan:  Foreign body on the left cornea. The eye was anesthetized with Tetracaine. The object was lifted off the cornea with a sterile needle. The eye was then flushed out with saline. He will use Tobramycin drops in the eye for 3 days, and recheck as needed. I advised him to always wear safety goggles while using a grinder.  Alysia Penna, MD

## 2020-02-26 ENCOUNTER — Other Ambulatory Visit: Payer: Self-pay | Admitting: Family Medicine

## 2020-07-14 ENCOUNTER — Other Ambulatory Visit: Payer: Self-pay | Admitting: Family Medicine

## 2020-07-16 ENCOUNTER — Encounter: Payer: Self-pay | Admitting: Family Medicine

## 2020-07-16 ENCOUNTER — Ambulatory Visit (INDEPENDENT_AMBULATORY_CARE_PROVIDER_SITE_OTHER): Payer: BC Managed Care – PPO | Admitting: Family Medicine

## 2020-07-16 ENCOUNTER — Other Ambulatory Visit: Payer: Self-pay

## 2020-07-16 VITALS — BP 110/72 | HR 80 | Temp 97.7°F | Ht 67.75 in | Wt 177.6 lb

## 2020-07-16 DIAGNOSIS — Z23 Encounter for immunization: Secondary | ICD-10-CM | POA: Diagnosis not present

## 2020-07-16 DIAGNOSIS — R079 Chest pain, unspecified: Secondary | ICD-10-CM

## 2020-07-16 DIAGNOSIS — Z0001 Encounter for general adult medical examination with abnormal findings: Secondary | ICD-10-CM | POA: Diagnosis not present

## 2020-07-16 DIAGNOSIS — Z0000 Encounter for general adult medical examination without abnormal findings: Secondary | ICD-10-CM | POA: Diagnosis not present

## 2020-07-16 MED ORDER — ZOLPIDEM TARTRATE 10 MG PO TABS
ORAL_TABLET | ORAL | 5 refills | Status: DC
Start: 2020-07-16 — End: 2020-08-24

## 2020-07-16 MED ORDER — ZOLPIDEM TARTRATE 10 MG PO TABS: ORAL_TABLET | ORAL | 5 refills | Status: DC

## 2020-07-16 NOTE — Addendum Note (Signed)
Addended by: Alysia Penna A on: 07/16/2020 04:51 PM   Modules accepted: Orders

## 2020-07-16 NOTE — Addendum Note (Signed)
Addended by: Matilde Sprang on: 07/16/2020 04:32 PM   Modules accepted: Orders

## 2020-07-16 NOTE — Progress Notes (Signed)
Subjective:    Patient ID: [name], male    DOB: 11-02-1960, 59 y.o.   MRN: [id number]  HPI Here for a well exam. He has one concern. He has had several episodes of chest heaviness when he was exerting himself recently. He describes some SOB with these spells. Each times they resolved in less than one minute when he stopped to rest. He had a normal stress test about 15 years ago. He notes he got married a few months ago. He saw Dr. Irene Limbo last March for polycythemia. At that time his Hgb was 18.4. Dr. Irene Limbo felt this was benign unless it changed any.    Review of Systems  Constitutional: Negative.   HENT: Negative.   Eyes: Negative.   Respiratory: Positive for shortness of breath.   Cardiovascular: Positive for chest pain.  Gastrointestinal: Negative.   Genitourinary: Negative.   Musculoskeletal: Negative.   Skin: Negative.   Neurological: Negative.   Psychiatric/Behavioral: Negative.        Objective:   Physical Exam Constitutional:      General: He is not in acute distress.    Appearance: He is well-developed. He is not diaphoretic.  HENT:     Head: Normocephalic and atraumatic.     Right Ear: External ear normal.     Left Ear: External ear normal.     Nose: Nose normal.     Mouth/Throat:     Pharynx: No oropharyngeal exudate.  Eyes:     General: No scleral icterus.       Right eye: No discharge.        Left eye: No discharge.     Conjunctiva/sclera: Conjunctivae normal.     Pupils: Pupils are equal, round, and reactive to light.  Neck:     Thyroid: No thyromegaly.     Vascular: No JVD.     Trachea: No tracheal deviation.  Cardiovascular:     Rate and Rhythm: Normal rate and regular rhythm.     Heart sounds: Normal heart sounds. No murmur heard.  No friction rub. No gallop.   Pulmonary:     Effort: Pulmonary effort is normal. No respiratory distress.     Breath sounds: Normal breath sounds. No wheezing or rales.  Chest:     Chest wall: No tenderness.    Abdominal:     General: Bowel sounds are normal. There is no distension.     Palpations: Abdomen is soft. There is no mass.     Tenderness: There is no abdominal tenderness. There is no guarding or rebound.  Genitourinary:    Penis: Normal. No tenderness.      Testes: Normal.     Prostate: Normal.     Rectum: Normal. Guaiac result negative.  Musculoskeletal:        General: No tenderness. Normal range of motion.     Cervical back: Neck supple.  Lymphadenopathy:     Cervical: No cervical adenopathy.  Skin:    General: Skin is warm and dry.     Coloration: Skin is not pale.     Findings: No erythema or rash.  Neurological:     Mental Status: He is alert and oriented to person, place, and time.     Cranial Nerves: No cranial nerve deficit.     Motor: No abnormal muscle tone.     Coordination: Coordination normal.     Deep Tendon Reflexes: Reflexes are normal and symmetric. Reflexes normal.  Psychiatric:  Behavior: Behavior normal.        Thought Content: Thought content normal.        Judgment: Judgment normal.           Assessment & Plan:  Well exam. We discussed diet and exercise. Get fasting labs. We will follow the Hgb. Set up a colonoscopy. Refer to cardiology for the chest pains. Alysia Penna, MD

## 2020-07-17 ENCOUNTER — Encounter: Payer: Self-pay | Admitting: Family Medicine

## 2020-07-17 LAB — LIPID PANEL
Cholesterol: 162 mg/dL (ref <200)
HDL: 30 mg/dL — ABNORMAL LOW (ref >=40)
LDL Cholesterol (Calc): 118 mg/dL (calc) — ABNORMAL HIGH
Non-HDL Cholesterol (Calc): 132 mg/dL (calc) — ABNORMAL HIGH (ref <130)
Total CHOL/HDL Ratio: 5.4 (calc) — ABNORMAL HIGH (ref <5.0)
Triglycerides: 52 mg/dL (ref <150)

## 2020-07-17 LAB — CBC WITH DIFFERENTIAL/PLATELET
Absolute Monocytes: 744 cells/uL (ref 200–950)
Basophils Absolute: 72 cells/uL (ref 0–200)
Basophils Relative: 1.2 %
Eosinophils Absolute: 90 cells/uL (ref 15–500)
Eosinophils Relative: 1.5 %
HCT: 54.5 % — ABNORMAL HIGH (ref 38.5–50.0)
Hemoglobin: 17.9 g/dL — ABNORMAL HIGH (ref 13.2–17.1)
Lymphs Abs: 816 cells/uL — ABNORMAL LOW (ref 850–3900)
MCH: 31.4 pg (ref 27.0–33.0)
MCHC: 32.8 g/dL (ref 32.0–36.0)
MCV: 95.6 fL (ref 80.0–100.0)
MPV: 10.3 fL (ref 7.5–12.5)
Monocytes Relative: 12.4 %
Neutro Abs: 4278 cells/uL (ref 1500–7800)
Neutrophils Relative %: 71.3 %
Platelets: 210 10*3/uL (ref 140–400)
RBC: 5.7 10*6/uL (ref 4.20–5.80)
RDW: 12.5 % (ref 11.0–15.0)
Total Lymphocyte: 13.6 %
WBC: 6 10*3/uL (ref 3.8–10.8)

## 2020-07-17 LAB — HEPATIC FUNCTION PANEL
AG Ratio: 1.9 (calc) (ref 1.0–2.5)
ALT: 51 U/L — ABNORMAL HIGH (ref 9–46)
AST: 36 U/L — ABNORMAL HIGH (ref 10–35)
Albumin: 4.2 g/dL (ref 3.6–5.1)
Alkaline phosphatase (APISO): 39 U/L (ref 35–144)
Bilirubin, Direct: 0.3 mg/dL — ABNORMAL HIGH (ref 0.0–0.2)
Globulin: 2.2 g/dL (calc) (ref 1.9–3.7)
Indirect Bilirubin: 1 mg/dL (calc) (ref 0.2–1.2)
Total Bilirubin: 1.3 mg/dL — ABNORMAL HIGH (ref 0.2–1.2)
Total Protein: 6.4 g/dL (ref 6.1–8.1)

## 2020-07-17 LAB — PSA: PSA: 0.56 ng/mL (ref <=4.0)

## 2020-07-17 LAB — BASIC METABOLIC PANEL
BUN: 12 mg/dL (ref 7–25)
CO2: 23 mmol/L (ref 20–32)
Calcium: 9.6 mg/dL (ref 8.6–10.3)
Chloride: 102 mmol/L (ref 98–110)
Creat: 1.08 mg/dL (ref 0.70–1.33)
Glucose, Bld: 84 mg/dL (ref 65–99)
Potassium: 5 mmol/L (ref 3.5–5.3)
Sodium: 138 mmol/L (ref 135–146)

## 2020-07-17 LAB — TSH: TSH: 1.63 mIU/L (ref 0.40–4.50)

## 2020-07-19 ENCOUNTER — Other Ambulatory Visit: Payer: Self-pay | Admitting: Family Medicine

## 2020-07-20 NOTE — Progress Notes (Signed)
Cardiology Office Note:   Date:  07/22/2020  NAME:  [name]    MRN: [id number] DOB:  [birth year]   PCP:  Laurey Morale, MD  Cardiologist:  No primary care provider on file.   Referring MD: Laurey Morale, MD   Chief Complaint  Patient presents with  . Chest Pain   History of Present Illness:   [name] is a 59 y.o. male with a hx of HLD, OSA who is being seen today for the evaluation of SOB at the request of Laurey Morale, MD.  He reports for the last 1 month he had episodes of shortness of breath.  He reports he is highly active.  He works for [employer].  He does a lot of strenuous outdoor work.  Apparently when he does activity such as very heavy lifting he can get a sensation of shortness of breath.  He also describes he get a little tightness chest.  He is able to walk and exercise several miles per day without any limitations.  This is only occurred 3 times.  Has occurred with heavy exertion.  It has bothered him as his father did die of a heart attack at age 47.  His symptoms of shortness of breath with chest pain or tightness only lasts seconds.  They go away pretty quickly.  He does have history of polycythemia.  This is secondary to sleep apnea.  He is treated for this.  His most recent lipid profile shows an LDL of 118.  He is a never smoker.  He does drink alcohol daily.  No illicit drug use reported.  He reports he walks up to 9 miles per day but with his job.  He also goes to the gym every day to lift weights and exercise.  His EKG today demonstrates normal sinus rhythm with anterolateral and inferior T wave inversions.  He is quite active and this could be athletic changes.  He never had a heart attack or stroke.  His father did die of a heart attack at age 24.  Problem List 1. OSA 2. Secondary polycythemia  3. HLD -T chol 162, HDL 30, LDL 118, TG 132  Past Medical History: Past Medical History:  Diagnosis Date  . Allergy   . History of  nephrolithiasis   . Hyperlipidemia   . Hypertension   . Sleep apnea     Past Surgical History: Past Surgical History:  Procedure Laterality Date  . APPENDECTOMY    . IRRIGATION AND DEBRIDEMENT ABSCESS Left 12/13/2015   Procedure: IRRIGATION AND DEBRIDEMENT LEFT GLUTEAL HEMATOMA;  Surgeon: Leighton Ruff, MD;  Location: [facility];  Service: General;  Laterality: Left;  . LUMBAR LAMINECTOMY    . UVULECTOMY      Current Medications: Current Meds  Medication Sig  . aspirin EC 81 MG tablet Take 1 tablet (81 mg total) by mouth daily.  Marland Kitchen zolpidem (AMBIEN) 10 MG tablet TAKE 2 TABLETS BY MOUTH AT BEDTIME AS NEEDED     Allergies:    Hydrocodone and Zetia [ezetimibe]   Social History: Social History   Socioeconomic History  . Marital status: Married    Spouse name: Not on file  . Number of children: 3  . Years of education: Not on file  . Highest education level: Not on file  Occupational History  . Not on file  Tobacco Use  . Smoking status: Never Smoker  . Smokeless tobacco: Never Used  Substance and Sexual Activity  .  Alcohol use: Yes    Alcohol/week: 0.0 standard drinks    Comment: occ  . Drug use: No  . Sexual activity: Not on file  Other Topics Concern  . Not on file  Social History Narrative  . Not on file   Social Determinants of Health   Financial Resource Strain:   . Difficulty of Paying Living Expenses: Not on file  Food Insecurity:   . Worried About Charity fundraiser in the Last Year: Not on file  . Ran Out of Food in the Last Year: Not on file  Transportation Needs:   . Lack of Transportation (Medical): Not on file  . Lack of Transportation (Non-Medical): Not on file  Physical Activity:   . Days of Exercise per Week: Not on file  . Minutes of Exercise per Session: Not on file  Stress:   . Feeling of Stress : Not on file  Social Connections:   . Frequency of Communication with Friends and Family: Not on file  . Frequency of Social Gatherings with  Friends and Family: Not on file  . Attends Religious Services: Not on file  . Active Member of Clubs or Organizations: Not on file  . Attends Archivist Meetings: Not on file  . Marital Status: Not on file    Family History: The patient's family history includes Asthma in his mother; Emphysema in his mother; Heart disease (age of onset: 26) in his father; Hyperlipidemia in an other family member; Hypertension in an other family member; Stroke in an other family member; Sudden death in an other family member.  ROS:   All other ROS reviewed and negative. Pertinent positives noted in the HPI.     EKGs/Labs/Other Studies Reviewed:   The following studies were personally reviewed by me today:  EKG:  EKG is ordered today.  The ekg ordered today demonstrates normal sinus rhythm, heart rate 91, T wave inversion noted in the inferolateral leads, and was personally reviewed by me.   Recent Labs: 07/16/2020: ALT 51; BUN 12; Creat 1.08; Hemoglobin 17.9; Platelets 210; Potassium 5.0; Sodium 138; TSH 1.63   Recent Lipid Panel    Component Value Date/Time   CHOL 162 07/16/2020 0940   TRIG 52 07/16/2020 0940   HDL 30 (L) 07/16/2020 0940   CHOLHDL 5.4 (H) 07/16/2020 0940   VLDL 24.6 07/16/2019 0934   LDLCALC 118 (H) 07/16/2020 0940   LDLDIRECT 166.6 10/16/2012 0805    Physical Exam:   VS:  BP 133/71   Pulse 91   Ht _0  (1.676 m)   Wt 172 lb (78 kg)   SpO2 96%   BMI 27.76 kg/m    Wt Readings from Last 3 Encounters:  07/22/20 172 lb (78 kg)  07/16/20 177 lb 9.6 oz (80.6 kg)  01/06/20 174 lb 9.6 oz (79.2 kg)    General: Well nourished, well developed, in no acute distress Heart: Atraumatic, normal size  Eyes: PEERLA, EOMI  Neck: Supple, no JVD Endocrine: No thryomegaly Cardiac: Normal S1, S2; RRR; no murmurs, rubs, or gallops Lungs: Clear to auscultation bilaterally, no wheezing, rhonchi or rales  Abd: Soft, nontender, no hepatomegaly  Ext: No edema, pulses  2+ Musculoskeletal: No deformities, BUE and BLE strength normal and equal Skin: Warm and dry, no rashes   Neuro: Alert and oriented to person, place, time, and situation, CNII-XII grossly intact, no focal deficits  Psych: Normal mood and affect   ASSESSMENT:   OLANDA DOWNIE is a 59  y.o. male who presents for the following: 1. SOB (shortness of breath) on exertion   2. Chest pain of uncertain etiology   3. Mixed hyperlipidemia     PLAN:   1. SOB (shortness of breath) on exertion 2. Chest pain of uncertain etiology -Episodic shortness of breath episodes that occur with heavy exertion.  He is highly active.  His EKG today does demonstrate diffuse T wave inversions in the anterior lateral, and inferior leads.  He has no murmur on exam.  He is highly active but his symptoms of shortness of breath with intermittent tightness are concerning.  He does have a strong family history of heart disease.  He has a low HDL cholesterol.  I have recommended an echocardiogram.  Have also recommended a coronary CTA.  This will also allow for further risk ratification with coronary calcium scoring.  I would like for him to take 100 mg of metoprolol tartrate before the scan.  He has no evidence of heart failure on exam.  He otherwise seems quite healthy.  3. Mixed hyperlipidemia -Monitor with diet and exercise.  Disposition: Return in about 3 months (around 10/22/2020).  Medication Adjustments/Labs and Tests Ordered: Current medicines are reviewed at length with the patient today.  Concerns regarding medicines are outlined above.  Orders Placed This Encounter  Procedures  . CT CORONARY MORPH W/CTA COR W/SCORE W/CA W/CM &/OR WO/CM  . CT CORONARY FRACTIONAL FLOW RESERVE DATA PREP  . CT CORONARY FRACTIONAL FLOW RESERVE FLUID ANALYSIS  . ECHOCARDIOGRAM COMPLETE   Meds ordered this encounter  Medications  . metoprolol tartrate (LOPRESSOR) 100 MG tablet    Sig: Take 100 mg (1 tablet) TWO hours prior to CT     Dispense:  1 tablet    Refill:  0    Patient Instructions  Medication Instructions:  Your physician recommends that you continue on your current medications as directed. Please refer to the Current Medication list given to you today.  Testing/Procedures: Your physician has requested that you have an echocardiogram. Echocardiography is a painless test that uses sound waves to create images of your heart. It provides your doctor with information about the size and shape of your heart and how well your heart's chambers and valves are working. This procedure takes approximately one hour. There are no restrictions for this procedure. This will be done at our [facility] location:  [address]  Coronary CTA-see instruction sheet  Follow-Up: At [facility], you and your health needs are our priority.  As part of our continuing mission to provide you with exceptional heart care, we have created designated Provider Care Teams.  These Care Teams include your primary Cardiologist (physician) and Advanced Practice Providers (APPs -  Physician Assistants and Nurse Practitioners) who all work together to provide you with the care you need, when you need it.  We recommend signing up for the patient portal called "MyChart".  Sign up information is provided on this After Visit Summary.  MyChart is used to connect with patients for Virtual Visits (Telemedicine).  Patients are able to view lab/test results, encounter notes, upcoming appointments, etc.  Non-urgent messages can be sent to your provider as well.   To learn more about what you can do with MyChart, go to [email or web address].    Your next appointment:   3 month(s)  The format for your next appointment:   In Person  Provider:   Eleonore Chiquito, MD   Other Instructions Your cardiac CT  will be scheduled at one of the below locations:   [facility] [address] [place], [place] [address] [phone]  [place] [address] [place], [place] [address] [phone]  If scheduled at [facility], please arrive at the [facility] main entrance of [facility] 30 minutes prior to test start time. Proceed to the [facility] Radiology Department (first floor) to check-in and test prep.  If scheduled at [facility], please arrive 15 mins early for check-in and test prep.  Please follow these instructions carefully (unless otherwise directed):  On the Night Before the Test: . Be sure to Drink plenty of water. . Do not consume any caffeinated/decaffeinated beverages or chocolate 12 hours prior to your test. . Do not take any antihistamines 12 hours prior to your test. .  On the Day of the Test: . Drink plenty of water. Do not drink any water within one hour of the test. . Do not eat any food 4 hours prior to the test. . You may take your regular medications prior to the test.  . Take metoprolol (Lopressor) two hours prior to test.      After the Test: . Drink plenty of water. . After receiving IV contrast, you may experience a mild flushed feeling. This is normal. . On occasion, you may experience a mild rash up to 24 hours after the test. This is not dangerous. If this occurs, you can take Benadryl 25 mg and increase your fluid intake. . If you experience trouble breathing, this can be serious. If it is severe call 911 IMMEDIATELY. If it is mild, please call our office.   Once we have confirmed authorization from your insurance company, we will call you to set up a date and time for your test. Based on how quickly your insurance processes prior authorizations requests, please allow up to 4 weeks to be contacted for scheduling your Cardiac CT appointment. Be advised that routine Cardiac CT appointments could be scheduled as many as 8 weeks after your provider has ordered it.  For  non-scheduling related questions, please contact the cardiac imaging nurse navigator should you have any questions/concerns: Marchia Bond, Cardiac Imaging Nurse Navigator Burley Saver, Interim Cardiac Imaging Nurse [place] and Vascular Services Direct Office Dial: [phone]   For scheduling needs, including cancellations and rescheduling, please call Vivien Rota at [phone], option 3.        Signed, Addison Naegeli. Audie Box, [place]  [address], [place], [place] [address] [phone]  07/22/2020 2:44 PM

## 2020-07-22 ENCOUNTER — Ambulatory Visit: Payer: BC Managed Care – PPO | Admitting: Cardiovascular Disease

## 2020-07-22 ENCOUNTER — Other Ambulatory Visit: Payer: Self-pay

## 2020-07-22 ENCOUNTER — Encounter: Payer: Self-pay | Admitting: Cardiovascular Disease

## 2020-07-22 VITALS — BP 133/71 | HR 91 | Ht 66.0 in | Wt 172.0 lb

## 2020-07-22 DIAGNOSIS — E782 Mixed hyperlipidemia: Secondary | ICD-10-CM

## 2020-07-22 DIAGNOSIS — R0602 Shortness of breath: Secondary | ICD-10-CM | POA: Diagnosis not present

## 2020-07-22 DIAGNOSIS — I10 Essential (primary) hypertension: Secondary | ICD-10-CM

## 2020-07-22 DIAGNOSIS — R079 Chest pain, unspecified: Secondary | ICD-10-CM

## 2020-07-22 MED ORDER — METOPROLOL TARTRATE 100 MG PO TABS: ORAL_TABLET | ORAL | 0 refills | Status: DC

## 2020-07-22 NOTE — Patient Instructions (Addendum)
Medication Instructions:  Your physician recommends that you continue on your current medications as directed. Please refer to the Current Medication list given to you today.  Testing/Procedures: Your physician has requested that you have an echocardiogram. Echocardiography is a painless test that uses sound waves to create images of your heart. It provides your doctor with information about the size and shape of your heart and how well your heart's chambers and valves are working. This procedure takes approximately one hour. There are no restrictions for this procedure. This will be done at our [facility] location:  [address]  Coronary CTA-see instruction sheet  Follow-Up: At [facility], you and your health needs are our priority.  As part of our continuing mission to provide you with exceptional heart care, we have created designated Provider Care Teams.  These Care Teams include your primary Cardiologist (physician) and Advanced Practice Providers (APPs -  Physician Assistants and Nurse Practitioners) who all work together to provide you with the care you need, when you need it.  We recommend signing up for the patient portal called "MyChart".  Sign up information is provided on this After Visit Summary.  MyChart is used to connect with patients for Virtual Visits (Telemedicine).  Patients are able to view lab/test results, encounter notes, upcoming appointments, etc.  Non-urgent messages can be sent to your provider as well.   To learn more about what you can do with MyChart, go to [email or web address].    Your next appointment:   3 month(s)  The format for your next appointment:   In Person  Provider:   Eleonore Chiquito, MD   Other Instructions Your cardiac CT will be scheduled at one of the below locations:   [facility] [address] [place], [place] [address] [phone]  [place] [address] [place], [place] [address] [phone]  If scheduled at [facility], please arrive at the [facility] main entrance of [facility] 30 minutes prior to test start time. Proceed to the [facility] Radiology Department (first floor) to check-in and test prep.  If scheduled at [facility], please arrive 15 mins early for check-in and test prep.  Please follow these instructions carefully (unless otherwise directed):  On the Night Before the Test: . Be sure to Drink plenty of water. . Do not consume any caffeinated/decaffeinated beverages or chocolate 12 hours prior to your test. . Do not take any antihistamines 12 hours prior to your test. .  On the Day of the Test: . Drink plenty of water. Do not drink any water within one hour of the test. . Do not eat any food 4 hours prior to the test. . You may take your regular medications prior to the test.  . Take metoprolol (Lopressor) two hours prior to test.      After the Test: . Drink plenty of water. . After receiving IV contrast, you may experience a mild flushed feeling. This is normal. . On occasion, you may experience a mild rash up to 24 hours after the test. This is not dangerous. If this occurs, you can take Benadryl 25 mg and increase your fluid intake. . If you experience trouble breathing, this can be serious. If it is severe call 911 IMMEDIATELY. If it is mild, please call our office.   Once we have confirmed authorization from your insurance company, we will call you to  set up a date and time for your test. Based on how quickly your insurance processes prior authorizations requests, please allow up to 4 weeks to be contacted for scheduling your Cardiac CT appointment. Be advised that routine Cardiac CT appointments could be scheduled as many as 8 weeks after your provider has ordered it.  For non-scheduling related questions, please contact the cardiac imaging  nurse navigator should you have any questions/concerns: Marchia Bond, Cardiac Imaging Nurse Navigator Burley Saver, Interim Cardiac Imaging Nurse [place] and Vascular Services Direct Office Dial: [phone]   For scheduling needs, including cancellations and rescheduling, please call Vivien Rota at [phone], option 3.

## 2020-08-03 ENCOUNTER — Telehealth (HOSPITAL_COMMUNITY): Payer: Self-pay | Admitting: Emergency Medicine

## 2020-08-03 NOTE — Telephone Encounter (Signed)
Pt returning phone call regarding upcoming cardiac imaging study; pt verbalizes understanding of appt date/time, parking situation and where to check in, pre-test NPO status and medications ordered, and verified current allergies; name and call back number provided for further questions should they arise Victor Bond RN Navigator Cardiac Imaging [place] and Vascular [phone] office [phone] cell  Pt verbalized understsanding to take metop 2 hr prior to scan. Avoiding ED medications. [name]

## 2020-08-03 NOTE — Telephone Encounter (Signed)
Attempted to call patient regarding upcoming cardiac CT appointment. °Left message on voicemail with name and callback number °Victor Brander RN Navigator Cardiac Imaging °[facility] °[phone] Office °[phone] Cell ° °

## 2020-08-04 ENCOUNTER — Ambulatory Visit (HOSPITAL_COMMUNITY)
Admission: RE | Admit: 2020-08-04 | Discharge: 2020-08-04 | Disposition: A | Discharge status: Home / Self Care | Payer: BC Managed Care – PPO | Source: Ambulatory Visit | Attending: Cardiovascular Disease | Admitting: Cardiovascular Disease

## 2020-08-04 ENCOUNTER — Encounter (HOSPITAL_COMMUNITY): Payer: Self-pay

## 2020-08-04 ENCOUNTER — Other Ambulatory Visit: Payer: Self-pay

## 2020-08-04 DIAGNOSIS — R079 Chest pain, unspecified: Secondary | ICD-10-CM | POA: Diagnosis not present

## 2020-08-04 DIAGNOSIS — I2510 Atherosclerotic heart disease of native coronary artery without angina pectoris: Secondary | ICD-10-CM | POA: Diagnosis not present

## 2020-08-04 MED ORDER — IOHEXOL 350 MG/ML SOLN
80.0000 mL | Freq: Once | INTRAVENOUS | Status: AC | PRN
  Administered 2020-08-04: 80 mL via INTRAVENOUS

## 2020-08-04 MED ORDER — METOPROLOL TARTRATE 5 MG/5ML IV SOLN: 5.0000 mg | INTRAVENOUS | Status: DC | PRN

## 2020-08-04 MED ORDER — NITROGLYCERIN 0.4 MG SL SUBL: 0.8000 mg | SUBLINGUAL_TABLET | Freq: Once | SUBLINGUAL | Status: AC

## 2020-08-04 MED ORDER — METOPROLOL TARTRATE 5 MG/5ML IV SOLN
INTRAVENOUS | Status: AC
  Administered 2020-08-04: 5 mg
  Filled 2020-08-04: qty 5

## 2020-08-04 MED ORDER — METOPROLOL TARTRATE 5 MG/5ML IV SOLN
INTRAVENOUS | Status: AC
  Administered 2020-08-04: 5 mg via INTRAVENOUS
  Filled 2020-08-04: qty 5

## 2020-08-04 MED ORDER — NITROGLYCERIN 0.4 MG SL SUBL
SUBLINGUAL_TABLET | SUBLINGUAL | Status: AC
  Administered 2020-08-04: 0.8 mg via SUBLINGUAL
  Filled 2020-08-04: qty 1

## 2020-08-05 ENCOUNTER — Other Ambulatory Visit: Payer: Self-pay | Admitting: *Deleted

## 2020-08-05 DIAGNOSIS — I2510 Atherosclerotic heart disease of native coronary artery without angina pectoris: Secondary | ICD-10-CM | POA: Diagnosis not present

## 2020-08-05 MED ORDER — METOPROLOL SUCCINATE ER 25 MG PO TB24: 25.0000 mg | ORAL_TABLET | Freq: Every day | ORAL | 3 refills | Status: DC

## 2020-08-05 MED ORDER — ROSUVASTATIN CALCIUM 20 MG PO TABS: 20.0000 mg | ORAL_TABLET | Freq: Every day | ORAL | 3 refills | Status: DC

## 2020-08-11 ENCOUNTER — Telehealth: Payer: Self-pay | Admitting: Family Medicine

## 2020-08-11 ENCOUNTER — Ambulatory Visit (HOSPITAL_COMMUNITY): Payer: BC Managed Care – PPO | Attending: Cardiovascular Disease

## 2020-08-11 ENCOUNTER — Other Ambulatory Visit: Payer: Self-pay

## 2020-08-11 DIAGNOSIS — R079 Chest pain, unspecified: Secondary | ICD-10-CM | POA: Diagnosis not present

## 2020-08-11 DIAGNOSIS — R0602 Shortness of breath: Secondary | ICD-10-CM | POA: Diagnosis not present

## 2020-08-11 LAB — ECHOCARDIOGRAM COMPLETE
Area-P 1/2: 3.95 cm2
S' Lateral: 2.9 cm

## 2020-08-11 NOTE — Telephone Encounter (Signed)
Pt is calling in stating that his Rx zolpidem (AMBIEN) 10 MG that is at [facility] they will not give him his full 60 tablets for a month and now pt would like to transfer his Rx back to [facility] in [place].

## 2020-08-12 NOTE — Telephone Encounter (Signed)
Called patient for clarity on Ambien per patient he was not able to receive 60 tabs for 1 month at [facility] and [facility]. I called [facility] per pharmacist patient picked up 60 tabs on 07/16/20 and he has refill that are not ready to be pick up until next week. Was not able to reach patient to clarify what it is that he needs due to [facility] already gave Rx for October and he should not run out until November 15th. No answer LMTCB

## 2020-08-24 ENCOUNTER — Telehealth: Payer: Self-pay

## 2020-08-24 MED ORDER — ZOLPIDEM TARTRATE 10 MG PO TABS
ORAL_TABLET | ORAL | 5 refills | Status: DC
Start: 2020-08-24 — End: 2021-02-22

## 2020-08-24 NOTE — Telephone Encounter (Signed)
Patient calling in requesting a refill on medication    zolpidem (AMBIEN) 10 MG tablet  [id number] [place], [place] - [address]

## 2020-08-24 NOTE — Telephone Encounter (Signed)
Zolpidem 10mg  LR 07/16/20, #60, 5 rf's to [facility].  Can [facility] please [facility]  Called [facility] and cancel the the refills.  Dm/cma

## 2020-08-24 NOTE — Telephone Encounter (Signed)
Done

## 2020-08-25 NOTE — Telephone Encounter (Signed)
Patient notified VIA phone.  No questions.  Dm/cma ? ?

## 2020-10-11 DIAGNOSIS — Z1152 Encounter for screening for COVID-19: Secondary | ICD-10-CM | POA: Diagnosis not present

## 2020-10-21 NOTE — Progress Notes (Deleted)
Cardiology Office Note:   Date:  10/21/2020  NAME:  [name]    MRN: [id number] DOB:  08-28-1961   PCP:  Laurey Morale, MD  Cardiologist:  No primary care provider on file.  Electrophysiologist:  None   Referring MD: Laurey Morale, MD   No chief complaint on file. ***  History of Present Illness:   [name] is a 60 y.o. male with a hx of CAD, HLD, OSA who presents for follow-up. Non-obstructive CAD on CCTA last year. Started on aspirin/statin.   Problem List 1. CAD -CAC score 141 (80th percentile) -Moderate CAD in LAD (50-69%) and RI (50-69%) -negative FFR 2. HLD -T chol 162, HDL 30, LDL 118, TG 132 3. OSA 4. Secondary polycythemia   Past Medical History: Past Medical History:  Diagnosis Date  . Allergy   . History of nephrolithiasis   . Hyperlipidemia   . Hypertension   . Sleep apnea     Past Surgical History: Past Surgical History:  Procedure Laterality Date  . APPENDECTOMY    . IRRIGATION AND DEBRIDEMENT ABSCESS Left 12/13/2015   Procedure: IRRIGATION AND DEBRIDEMENT LEFT GLUTEAL HEMATOMA;  Surgeon: Leighton Ruff, MD;  Location: [facility];  Service: General;  Laterality: Left;  . LUMBAR LAMINECTOMY    . UVULECTOMY      Current Medications: No outpatient medications have been marked as taking for the 10/22/20 encounter (Appointment) with O'Neal, Cassie Freer, MD.     Allergies:    Hydrocodone and Zetia [ezetimibe]   Social History: Social History   Socioeconomic History  . Marital status: Married    Spouse name: Not on file  . Number of children: 3  . Years of education: Not on file  . Highest education level: Not on file  Occupational History  . Not on file  Tobacco Use  . Smoking status: Never Smoker  . Smokeless tobacco: Never Used  Substance and Sexual Activity  . Alcohol use: Yes    Alcohol/week: 0.0 standard drinks    Comment: occ  . Drug use: No  . Sexual activity: Not on file  Other Topics Concern  . Not on file  Social  History Narrative  . Not on file   Social Determinants of Health   Financial Resource Strain: Not on file  Food Insecurity: Not on file  Transportation Needs: Not on file  Physical Activity: Not on file  Stress: Not on file  Social Connections: Not on file     Family History: The patient's ***family history includes Asthma in his mother; Emphysema in his mother; Heart disease (age of onset: 19) in his father; Hyperlipidemia in an other family member; Hypertension in an other family member; Stroke in an other family member; Sudden death in an other family member.  ROS:   All other ROS reviewed and negative. Pertinent positives noted in the HPI.     EKGs/Labs/Other Studies Reviewed:   The following studies were personally reviewed by me today:  EKG:  EKG is *** ordered today.  The ekg ordered today demonstrates ***, and was personally reviewed by me.   CCTA 08/04/2020 1. Coronary calcium score of 141. This was 80th percentile for age and sex matched controls.  2. Normal coronary origin with right dominance.  3. Moderate stenosis (50-69%) in the mid LAD.  4. Moderate stenosis in the ramus (50-69%).  5. Mild stenosis in th RCA and PDA (25-49%).  6. Minimal stenosis in the LCX (<25%).  CT FFR 08/05/2020  1. Left Main: 0.97; no significant stenosis.  2. Proximal LAD: 0.95; no significant stenosis. 3. Mid LAD: 0.89; no significant stenosis. 4. First Diagonal: 0.86; no significant stenosis. 5. Ramus: Too small for analysis. 6. LCX: 0.93; no significant stenosis. 7. RCA: 0.91; no significant stenosis. 8. Distal PDA: 0.78; borderline significance but distal vessel.  TTE 08/11/2020 1. Prominant somewhat apically displaced papillary muscles. Low normal  GLS -14.3. Left ventricular ejection fraction, by estimation, is 60 to  65%. The left ventricle has normal function. The left ventricle has no  regional wall motion abnormalities. The  left ventricular internal cavity  size was mildly dilated. Left ventricular  diastolic parameters are consistent with Grade I diastolic dysfunction  (impaired relaxation).  2. Right ventricular systolic function is normal. The right ventricular  size is normal. There is normal pulmonary artery systolic pressure.  3. The mitral valve is normal in structure. Trivial mitral valve  regurgitation. No evidence of mitral stenosis.  4. The aortic valve is normal in structure. Aortic valve regurgitation is  not visualized. No aortic stenosis is present.  5. The inferior vena cava is normal in size with greater than 50%  respiratory variability, suggesting right atrial pressure of 3 mmHg.   Recent Labs: 07/16/2020: ALT 51; BUN 12; Creat 1.08; Hemoglobin 17.9; Platelets 210; Potassium 5.0; Sodium 138; TSH 1.63   Recent Lipid Panel    Component Value Date/Time   CHOL 162 07/16/2020 0940   TRIG 52 07/16/2020 0940   HDL 30 (L) 07/16/2020 0940   CHOLHDL 5.4 (H) 07/16/2020 0940   VLDL 24.6 07/16/2019 0934   LDLCALC 118 (H) 07/16/2020 0940   LDLDIRECT 166.6 10/16/2012 0805    Physical Exam:   VS:  There were no vitals taken for this visit.   Wt Readings from Last 3 Encounters:  07/22/20 172 lb (78 kg)  07/16/20 177 lb 9.6 oz (80.6 kg)  01/06/20 174 lb 9.6 oz (79.2 kg)    General: Well nourished, well developed, in no acute distress Head: Atraumatic, normal size  Eyes: PEERLA, EOMI  Neck: Supple, no JVD Endocrine: No thryomegaly Cardiac: Normal S1, S2; RRR; no murmurs, rubs, or gallops Lungs: Clear to auscultation bilaterally, no wheezing, rhonchi or rales  Abd: Soft, nontender, no hepatomegaly  Ext: No edema, pulses 2+ Musculoskeletal: No deformities, BUE and BLE strength normal and equal Skin: Warm and dry, no rashes   Neuro: Alert and oriented to person, place, time, and situation, CNII-XII grossly intact, no focal deficits  Psych: Normal mood and affect   ASSESSMENT:   [name] is a 60 y.o. male who  presents for the following: No diagnosis found.  PLAN:   There are no diagnoses linked to this encounter.  Disposition: No follow-ups on file.  Medication Adjustments/Labs and Tests Ordered: Current medicines are reviewed at length with the patient today.  Concerns regarding medicines are outlined above.  No orders of the defined types were placed in this encounter.  No orders of the defined types were placed in this encounter.   There are no Patient Instructions on file for this visit.   Time Spent with Patient: I have spent a total of *** minutes with patient reviewing hospital notes, telemetry, EKGs, labs and examining the patient as well as establishing an assessment and plan that was discussed with the patient.  > 50% of time was spent in direct patient care.  Signed, Addison Naegeli. Audie Box, [place]  [address]  [place], [place] [address] [phone]  10/21/2020 2:25 PM

## 2020-10-22 ENCOUNTER — Ambulatory Visit: Payer: BC Managed Care – PPO | Admitting: Cardiovascular Disease

## 2020-10-22 DIAGNOSIS — I2510 Atherosclerotic heart disease of native coronary artery without angina pectoris: Secondary | ICD-10-CM

## 2020-10-22 DIAGNOSIS — E782 Mixed hyperlipidemia: Secondary | ICD-10-CM

## 2020-10-29 DIAGNOSIS — M7541 Impingement syndrome of right shoulder: Secondary | ICD-10-CM | POA: Diagnosis not present

## 2020-10-29 DIAGNOSIS — M19011 Primary osteoarthritis, right shoulder: Secondary | ICD-10-CM | POA: Diagnosis not present

## 2020-10-29 DIAGNOSIS — S4991XA Unspecified injury of right shoulder and upper arm, initial encounter: Secondary | ICD-10-CM | POA: Diagnosis not present

## 2021-02-21 ENCOUNTER — Other Ambulatory Visit: Payer: Self-pay | Admitting: Family Medicine

## 2021-02-21 NOTE — Telephone Encounter (Signed)
Last office visit-07/16/20 Last refill-08/24/20-60 tabs with 5 refills  No future office visit scheduled

## 2021-03-17 ENCOUNTER — Ambulatory Visit (INDEPENDENT_AMBULATORY_CARE_PROVIDER_SITE_OTHER): Payer: BC Managed Care – PPO | Admitting: Family Medicine

## 2021-03-17 ENCOUNTER — Encounter: Payer: Self-pay | Admitting: Family Medicine

## 2021-03-17 ENCOUNTER — Other Ambulatory Visit: Payer: Self-pay

## 2021-03-17 VITALS — BP 122/86 | HR 87 | Temp 98.2°F | Wt 173.0 lb

## 2021-03-17 DIAGNOSIS — J019 Acute sinusitis, unspecified: Secondary | ICD-10-CM

## 2021-03-17 MED ORDER — AZITHROMYCIN 250 MG PO TABS: ORAL_TABLET | ORAL | 0 refills | Status: DC

## 2021-03-17 NOTE — Progress Notes (Signed)
   Subjective:    Patient ID: [name], male    DOB: [birth year], 60 y.o.   MRN: [id number]  HPI Here for 4 weeks of sinus congestion, PND, and coughing up green sputum. No SOB or fever. No body aches or NVD. Taking Allegra D.   Review of Systems  Constitutional: Negative.   HENT:  Positive for congestion, postnasal drip and sinus pressure. Negative for ear pain and sore throat.   Eyes: Negative.   Respiratory:  Positive for cough. Negative for shortness of breath and wheezing.   Gastrointestinal: Negative.       Objective:   Physical Exam Constitutional:      Appearance: Normal appearance. He is not ill-appearing.  HENT:     Right Ear: Tympanic membrane, ear canal and external ear normal.     Left Ear: Tympanic membrane, ear canal and external ear normal.     Nose: Nose normal.     Mouth/Throat:     Pharynx: Oropharynx is clear.  Eyes:     Conjunctiva/sclera: Conjunctivae normal.  Pulmonary:     Effort: Pulmonary effort is normal.     Breath sounds: Normal breath sounds.  Lymphadenopathy:     Cervical: No cervical adenopathy.  Neurological:     Mental Status: He is alert.          Assessment & Plan:  Sinusitis, treat with a Zpack. Add Mucinex as needed . Alysia Penna, MD

## 2021-09-09 ENCOUNTER — Encounter: Payer: Self-pay | Admitting: Family Medicine

## 2021-09-09 ENCOUNTER — Ambulatory Visit (INDEPENDENT_AMBULATORY_CARE_PROVIDER_SITE_OTHER): Payer: BC Managed Care – PPO | Admitting: Family Medicine

## 2021-09-09 VITALS — BP 132/80 | HR 73 | Temp 98.6°F | Ht 66.0 in | Wt 179.0 lb

## 2021-09-09 DIAGNOSIS — D751 Secondary polycythemia: Secondary | ICD-10-CM | POA: Diagnosis not present

## 2021-09-09 DIAGNOSIS — Z0000 Encounter for general adult medical examination without abnormal findings: Secondary | ICD-10-CM | POA: Diagnosis not present

## 2021-09-09 MED ORDER — ROSUVASTATIN CALCIUM 20 MG PO TABS: 20.0000 mg | ORAL_TABLET | Freq: Every day | ORAL | 3 refills | Status: DC

## 2021-09-09 MED ORDER — METOPROLOL SUCCINATE ER 25 MG PO TB24: 25.0000 mg | ORAL_TABLET | Freq: Every day | ORAL | 3 refills | Status: DC

## 2021-09-09 MED ORDER — ZOLPIDEM TARTRATE 10 MG PO TABS
20.0000 mg | ORAL_TABLET | Freq: Every day | ORAL | 5 refills | Status: DC
Start: 2021-09-09 — End: 2022-03-20

## 2021-09-09 NOTE — Progress Notes (Signed)
Subjective:    Patient ID: [name], male    DOB: 1961-04-19, 60 y.o.   MRN: [id number]  HPI Here for a well exam. He feels fine. He has seen Dr. Irene Limbo several times for his polycythemia, the last time on March 2021. His Hgb has slowly come down from 19.4 in October 2020 to 17.9 on 07-16-20. He attempted to donate blood at the [facility], as was suggested, but they declined him saying his Hgb was "too high".    Review of Systems  Constitutional: Negative.   HENT: Negative.    Eyes: Negative.   Respiratory: Negative.    Cardiovascular: Negative.   Gastrointestinal: Negative.   Genitourinary: Negative.   Musculoskeletal: Negative.   Skin: Negative.   Neurological: Negative.   Psychiatric/Behavioral: Negative.        Objective:   Physical Exam Constitutional:      General: He is not in acute distress.    Appearance: Normal appearance. He is well-developed. He is not diaphoretic.  HENT:     Head: Normocephalic and atraumatic.     Right Ear: External ear normal.     Left Ear: External ear normal.     Nose: Nose normal.     Mouth/Throat:     Pharynx: No oropharyngeal exudate.  Eyes:     General: No scleral icterus.       Right eye: No discharge.        Left eye: No discharge.     Conjunctiva/sclera: Conjunctivae normal.     Pupils: Pupils are equal, round, and reactive to light.  Neck:     Thyroid: No thyromegaly.     Vascular: No JVD.     Trachea: No tracheal deviation.  Cardiovascular:     Rate and Rhythm: Normal rate and regular rhythm.     Heart sounds: Normal heart sounds. No murmur heard.   No friction rub. No gallop.  Pulmonary:     Effort: Pulmonary effort is normal. No respiratory distress.     Breath sounds: Normal breath sounds. No wheezing or rales.  Chest:     Chest wall: No tenderness.  Abdominal:     General: Bowel sounds are normal. There is no distension.     Palpations: Abdomen is soft. There is no mass.     Tenderness: There is no abdominal  tenderness. There is no guarding or rebound.  Genitourinary:    Penis: Normal. No tenderness.      Testes: Normal.     Prostate: Normal.     Rectum: Normal. Guaiac result negative.  Musculoskeletal:        General: No tenderness. Normal range of motion.     Cervical back: Neck supple.  Lymphadenopathy:     Cervical: No cervical adenopathy.  Skin:    General: Skin is warm and dry.     Coloration: Skin is not pale.     Findings: No erythema or rash.  Neurological:     Mental Status: He is alert and oriented to person, place, and time.     Cranial Nerves: No cranial nerve deficit.     Motor: No abnormal muscle tone.     Coordination: Coordination normal.     Deep Tendon Reflexes: Reflexes are normal and symmetric. Reflexes normal.  Psychiatric:        Behavior: Behavior normal.        Thought Content: Thought content normal.        Judgment: Judgment normal.  Assessment & Plan:  Well exam. We discussed diet and exercise. Get fasting labs next week. We will check another CBC and go from there. Set up a colonoscopy.  Alysia Penna, MD

## 2021-09-13 ENCOUNTER — Telehealth: Payer: Self-pay

## 2021-09-13 ENCOUNTER — Other Ambulatory Visit (INDEPENDENT_AMBULATORY_CARE_PROVIDER_SITE_OTHER): Payer: BC Managed Care – PPO

## 2021-09-13 DIAGNOSIS — D751 Secondary polycythemia: Secondary | ICD-10-CM

## 2021-09-13 DIAGNOSIS — Z0000 Encounter for general adult medical examination without abnormal findings: Secondary | ICD-10-CM

## 2021-09-13 DIAGNOSIS — Z125 Encounter for screening for malignant neoplasm of prostate: Secondary | ICD-10-CM

## 2021-09-13 LAB — CBC WITH DIFFERENTIAL/PLATELET
Basophils Absolute: 0.1 10*3/uL (ref 0.0–0.1)
Basophils Relative: 1 % (ref 0.0–3.0)
Eosinophils Absolute: 0.1 10*3/uL (ref 0.0–0.7)
Eosinophils Relative: 1.9 % (ref 0.0–5.0)
HCT: 54.9 % — ABNORMAL HIGH (ref 39.0–52.0)
Hemoglobin: 18.2 g/dL (ref 13.0–17.0)
Lymphocytes Relative: 13.3 % (ref 12.0–46.0)
Lymphs Abs: 0.9 10*3/uL (ref 0.7–4.0)
MCHC: 33.1 g/dL (ref 30.0–36.0)
MCV: 94.8 fl (ref 78.0–100.0)
Monocytes Absolute: 0.9 10*3/uL (ref 0.1–1.0)
Monocytes Relative: 13.3 % — ABNORMAL HIGH (ref 3.0–12.0)
Neutro Abs: 4.9 10*3/uL (ref 1.4–7.7)
Neutrophils Relative %: 70.5 % (ref 43.0–77.0)
Platelets: 165 10*3/uL (ref 150.0–400.0)
RBC: 5.79 Mil/uL (ref 4.22–5.81)
RDW: 14.4 % (ref 11.5–15.5)
WBC: 6.9 10*3/uL (ref 4.0–10.5)

## 2021-09-13 LAB — BASIC METABOLIC PANEL
BUN: 10 mg/dL (ref 6–23)
CO2: 29 mEq/L (ref 19–32)
Calcium: 9.9 mg/dL (ref 8.4–10.5)
Chloride: 101 mEq/L (ref 96–112)
Creatinine, Ser: 0.91 mg/dL (ref 0.40–1.50)
GFR: 93.09 mL/min (ref >60.00)
Glucose, Bld: 76 mg/dL (ref 70–99)
Potassium: 4.3 mEq/L (ref 3.5–5.1)
Sodium: 137 mEq/L (ref 135–145)

## 2021-09-13 LAB — LIPID PANEL
Cholesterol: 179 mg/dL (ref 0–200)
HDL: 33.2 mg/dL — ABNORMAL LOW (ref >39.00)
LDL Cholesterol: 128 mg/dL — ABNORMAL HIGH (ref 0–99)
NonHDL: 146.23
Total CHOL/HDL Ratio: 5
Triglycerides: 89 mg/dL (ref 0.0–149.0)
VLDL: 17.8 mg/dL (ref 0.0–40.0)

## 2021-09-13 LAB — IBC + FERRITIN
Ferritin: 48 ng/mL (ref 22.0–322.0)
Iron: 174 ug/dL — ABNORMAL HIGH (ref 42–165)
Saturation Ratios: 43.6 % (ref 20.0–50.0)
TIBC: 399 ug/dL (ref 250.0–450.0)
Transferrin: 285 mg/dL (ref 212.0–360.0)

## 2021-09-13 LAB — HEPATIC FUNCTION PANEL
ALT: 27 U/L (ref 0–53)
AST: 34 U/L (ref 0–37)
Albumin: 4 g/dL (ref 3.5–5.2)
Alkaline Phosphatase: 42 U/L (ref 39–117)
Bilirubin, Direct: 0.3 mg/dL (ref 0.0–0.3)
Total Bilirubin: 1.7 mg/dL — ABNORMAL HIGH (ref 0.2–1.2)
Total Protein: 6.4 g/dL (ref 6.0–8.3)

## 2021-09-13 LAB — PSA: PSA: 1.32 ng/mL (ref 0.10–4.00)

## 2021-09-13 LAB — HEMOGLOBIN A1C: Hgb A1c MFr Bld: 5.3 % (ref 4.6–6.5)

## 2021-09-13 LAB — IRON: Iron: 174 ug/dL — ABNORMAL HIGH (ref 42–165)

## 2021-09-13 LAB — TSH: TSH: 1 u[IU]/mL (ref 0.35–5.50)

## 2021-09-13 NOTE — Telephone Encounter (Signed)
CRITICAL VALUE STICKER  CRITICAL VALUE: Hgb 18.2  RECEIVER (on-site recipient of call): Elza Rafter [facility]  DATE & TIME NOTIFIED: 09/13/21 1050  MESSENGER (representative from lab): Ovid Curd  MD NOTIFIED: Sarajane Jews  TIME OF NOTIFICATION: 1051  RESPONSE: Awaiting MD response.

## 2021-09-13 NOTE — Telephone Encounter (Signed)
See my Result Note today

## 2021-10-25 DIAGNOSIS — J329 Chronic sinusitis, unspecified: Secondary | ICD-10-CM | POA: Diagnosis not present

## 2021-10-25 DIAGNOSIS — K219 Gastro-esophageal reflux disease without esophagitis: Secondary | ICD-10-CM | POA: Diagnosis not present

## 2021-10-25 DIAGNOSIS — F17220 Nicotine dependence, chewing tobacco, uncomplicated: Secondary | ICD-10-CM | POA: Diagnosis not present

## 2021-10-25 DIAGNOSIS — J3489 Other specified disorders of nose and nasal sinuses: Secondary | ICD-10-CM | POA: Diagnosis not present

## 2021-10-25 DIAGNOSIS — Z8709 Personal history of other diseases of the respiratory system: Secondary | ICD-10-CM | POA: Diagnosis not present

## 2021-11-11 ENCOUNTER — Ambulatory Visit: Payer: BC Managed Care – PPO | Admitting: Family Medicine

## 2021-11-11 VITALS — BP 120/80 | HR 84 | Temp 98.6°F | Resp 16 | Ht 66.0 in | Wt 182.2 lb

## 2021-11-11 DIAGNOSIS — J3089 Other allergic rhinitis: Secondary | ICD-10-CM

## 2021-11-11 DIAGNOSIS — H9202 Otalgia, left ear: Secondary | ICD-10-CM | POA: Diagnosis not present

## 2021-11-11 DIAGNOSIS — J029 Acute pharyngitis, unspecified: Secondary | ICD-10-CM

## 2021-11-11 LAB — POCT RAPID STREP A (OFFICE): Rapid Strep A Screen: NEGATIVE

## 2021-11-11 LAB — POCT INFLUENZA A/B: Influenza A, POC: NEGATIVE

## 2021-11-11 LAB — POC COVID19 BINAXNOW: SARS Coronavirus 2 Ag: NEGATIVE

## 2021-11-11 MED ORDER — PREDNISONE 20 MG PO TABS: 40.0000 mg | ORAL_TABLET | Freq: Every day | ORAL | 0 refills | Status: AC

## 2021-11-11 MED ORDER — AMOXICILLIN-POT CLAVULANATE 875-125 MG PO TABS: 1.0000 | ORAL_TABLET | Freq: Two times a day (BID) | ORAL | 0 refills | Status: AC

## 2021-11-11 MED ORDER — NEOMYCIN-POLYMYXIN-HC 3.5-10000-1 OT SOLN: 3.0000 [drp] | Freq: Three times a day (TID) | OTIC | 0 refills | Status: AC

## 2021-11-11 NOTE — Patient Instructions (Addendum)
A few things to remember from today's visit:  Non-seasonal allergic rhinitis, unspecified trigger - Plan: predniSONE (DELTASONE) 20 MG tablet  Sore throat - Plan: POC Influenza A/B, POC COVID-19 Earache on left  Dysfunction of both eustachian tubes  If you need refills please call your pharmacy. Do not use My Chart to request refills or for acute issues that need immediate attention.   I do not think antibiotic is needed at this time. Ears slightly red but I think it is because recently you did fly. Popping your ears genteelly may help to open eustachian tube.  Prednisone may help, take if with breakfast. In 2-3 days of still having earache you can start antibiotic. Keep appt with immunologist.  Please be sure medication list is accurate. If a new problem present, please set up appointment sooner than planned today.

## 2021-11-11 NOTE — Progress Notes (Signed)
ACUTE VISIT Chief Complaint  Patient presents with   URI    Cough, earache, sore throat   HPI: [name] is a 61 y.o. male with hx of HLD,allergic rhinitis,anxiety,and HTN here today with above complaints.  URI  This is a recurrent problem. The current episode started in the past 7 days. The problem has been waxing and waning. There has been no fever. Associated symptoms include congestion, coughing, ear pain, headaches (Mild frontal pressure headache.), a plugged ear sensation, rhinorrhea, sneezing and a sore throat. Pertinent negatives include no abdominal pain, chest pain, diarrhea, dysuria, joint pain, joint swelling, nausea, neck pain, rash, sinus pain, swollen glands, vomiting or wheezing. He has tried decongestant and antihistamine for the symptoms. The treatment provided mild relief.  States that for a while he has had nasal congestion,rhinorrhea,and post nasal drainage. Rhinorrhea with greenish color in the morning and clear in the afternoon. Sore throat, which he attributes to post nasal drainage.  Productive cough with greenish sputum sometimes, no hemoptysis. A week of left ear ache. Noted small amount of blood when used a Qtip to clean ear. No hx of trauma.  He did fly 2-3 days ago. Fullness sensation.  Allergic rhinitis: He is taking Allegra D. He has an appt with immunologist in 11/2021.  Review of Systems  Constitutional:  Positive for fatigue. Negative for activity change, appetite change and chills.  HENT:  Positive for congestion, ear pain, rhinorrhea, sneezing and sore throat. Negative for sinus pain.   Eyes:  Negative for redness and itching.  Respiratory:  Positive for cough. Negative for shortness of breath and wheezing.   Cardiovascular:  Negative for chest pain.  Gastrointestinal:  Negative for abdominal pain, diarrhea, nausea and vomiting.  Genitourinary:  Negative for dysuria.  Musculoskeletal:  Negative for joint pain and neck pain.  Skin:   Negative for rash.  Allergic/Immunologic: Positive for environmental allergies.  Neurological:  Positive for headaches (Mild frontal pressure headache.). Negative for syncope and weakness.  Hematological:  Negative for adenopathy. Does not bruise/bleed easily.  Rest see pertinent positives and negatives per HPI.  Current Outpatient Medications on File Prior to Visit  Medication Sig Dispense Refill   aspirin EC 81 MG tablet Take 1 tablet (81 mg total) by mouth daily. 30 tablet    metoprolol succinate (TOPROL XL) 25 MG 24 hr tablet Take 1 tablet (25 mg total) by mouth daily. 90 tablet 3   rosuvastatin (CRESTOR) 20 MG tablet Take 1 tablet (20 mg total) by mouth daily. 90 tablet 3   zolpidem (AMBIEN) 10 MG tablet Take 2 tablets (20 mg total) by mouth at bedtime. 60 tablet 5   No current facility-administered medications on file prior to visit.   Past Medical History:  Diagnosis Date   Allergy    History of nephrolithiasis    Hyperlipidemia    Hypertension    Sleep apnea    Allergies  Allergen Reactions   Hydrocodone Hives   Zetia [Ezetimibe] Rash    Social History   Socioeconomic History   Marital status: Married    Spouse name: Not on file   Number of children: 3   Years of education: Not on file   Highest education level: Associate degree: academic program  Occupational History   Not on file  Tobacco Use   Smoking status: Never   Smokeless tobacco: Never  Substance and Sexual Activity   Alcohol use: Yes    Alcohol/week: 0.0 standard drinks  Comment: occ   Drug use: No   Sexual activity: Not on file  Other Topics Concern   Not on file  Social History Narrative   Not on file   Social Determinants of Health   Financial Resource Strain: Low Risk    Difficulty of Paying Living Expenses: Not hard at all  Food Insecurity: No Food Insecurity   Worried About Running Out of Food in the Last Year: Never true   [place] in the Last Year: Never true   Transportation Needs: No Transportation Needs   Lack of Transportation (Medical): No   Lack of Transportation (Non-Medical): No  Physical Activity: Sufficiently Active   Days of Exercise per Week: 5 days   Minutes of Exercise per Session: 50 min  Stress: No Stress Concern Present   Feeling of Stress : Not at all  Social Connections: Moderately Isolated   Frequency of Communication with Friends and Family: Three times a week   Frequency of Social Gatherings with Friends and Family: More than three times a week   Attends Religious Services: Never   Marine scientist or Organizations: No   Attends Archivist Meetings: Not on file   Marital Status: Married   Vitals:   11/11/21 1447  BP: 120/80  Pulse: 84  Resp: 16  Temp: 98.6 F (37 C)  SpO2: 98%   Body mass index is 29.41 kg/m.  Physical Exam Vitals and nursing note reviewed.  Constitutional:      General: He is not in acute distress.    Appearance: He is well-developed. He is not ill-appearing.  HENT:     Head: Normocephalic and atraumatic.     Right Ear: Ear canal and external ear normal. A middle ear effusion is present.     Left Ear: Ear canal and external ear normal. A middle ear effusion is present.     Ears:     Comments: Left ear tenderness with pressing tragus and pulling ear lobe. Mild ear canal erythema. TM's with minimal erythema posterior superior quadrant,, bilateral, L>R.    Nose: Septal deviation, congestion and rhinorrhea present.     Right Nostril: Occlusion present.     Left Nostril: Occlusion present.     Right Turbinates: Enlarged.     Left Turbinates: Enlarged.     Right Sinus: No maxillary sinus tenderness or frontal sinus tenderness.     Left Sinus: No maxillary sinus tenderness or frontal sinus tenderness.     Comments: Mouth breathing. Eyes:     Conjunctiva/sclera: Conjunctivae normal.  Cardiovascular:     Rate and Rhythm: Normal rate and regular rhythm.     Heart sounds: No  murmur heard. Pulmonary:     Effort: Pulmonary effort is normal. No respiratory distress.     Breath sounds: Normal breath sounds. No stridor.  Lymphadenopathy:     Head:     Right side of head: No submandibular adenopathy.     Left side of head: No submandibular adenopathy.     Cervical: No cervical adenopathy.  Skin:    General: Skin is warm.     Findings: No erythema or rash.  Neurological:     Mental Status: He is alert and oriented to person, place, and time.     Gait: Gait normal.  Psychiatric:     Comments: Well groomed, good eye contact.   ASSESSMENT AND PLAN:  [name] was seen today for uri.  Diagnoses and all orders for this visit:  Non-seasonal allergic rhinitis, unspecified trigger With acute exacerbation. We discussed differential Dx, with includes viral URI. After discussion of some side effects he agrees with trying a short course of Prednisone. Continue nasal saline irrigations and Allegra D. He has an appt with immunologist in 11/2021.  -     predniSONE (DELTASONE) 20 MG tablet; Take 2 tablets (40 mg total) by mouth daily with breakfast for 3 days.  Sore throat Findings on examination and hx do not suggest bacterial process. Rapid strep,COVID 19,and flu test negative.  Earache on left On examination minimal erythema posterior superior quadrant, he is not having right earache. We discussed possible etiologies. It could be caused by recent travel,barometric changes.  Auto inflation maneuvers a few times during the day.  I do not think oral abx is needed at this time. Augmentin abx sent, he was instructed to start taking it in 2-3 days of not any improvement or is symptoms get worse.  Left ear canal mildly erythematous, so topical Cortisporin recommended to treat for possible infectious external otitis.  -     amoxicillin-clavulanate (AUGMENTIN) 875-125 MG tablet; Take 1 tablet by mouth 2 (two) times daily for 7 days. -      neomycin-polymyxin-hydrocortisone (CORTISPORIN) OTIC solution; Place 3 drops into the left ear 3 (three) times daily for 7 days.  Return if symptoms worsen or fail to improve.  Abdelrahman Nair G. Martinique, MD  [facility]. [place] office.

## 2021-12-16 DIAGNOSIS — G8929 Other chronic pain: Secondary | ICD-10-CM | POA: Diagnosis not present

## 2021-12-16 DIAGNOSIS — M67813 Other specified disorders of tendon, right shoulder: Secondary | ICD-10-CM | POA: Diagnosis not present

## 2022-02-09 DIAGNOSIS — J3089 Other allergic rhinitis: Secondary | ICD-10-CM | POA: Diagnosis not present

## 2022-02-09 DIAGNOSIS — K2100 Gastro-esophageal reflux disease with esophagitis, without bleeding: Secondary | ICD-10-CM | POA: Diagnosis not present

## 2022-02-20 DIAGNOSIS — J3089 Other allergic rhinitis: Secondary | ICD-10-CM | POA: Diagnosis not present

## 2022-03-20 ENCOUNTER — Other Ambulatory Visit: Payer: Self-pay | Admitting: Family Medicine

## 2022-03-20 NOTE — Telephone Encounter (Signed)
Last OV with Dr. Martinique- 11/11/21 Last refill- 09/09/21--60 tabs, 5 refills  No future OV scheduled.

## 2022-06-01 DIAGNOSIS — M5442 Lumbago with sciatica, left side: Secondary | ICD-10-CM | POA: Diagnosis not present

## 2022-06-01 DIAGNOSIS — M5136 Other intervertebral disc degeneration, lumbar region: Secondary | ICD-10-CM | POA: Diagnosis not present

## 2022-06-01 DIAGNOSIS — G8929 Other chronic pain: Secondary | ICD-10-CM | POA: Diagnosis not present

## 2022-06-27 ENCOUNTER — Other Ambulatory Visit: Payer: Self-pay | Admitting: Orthopedic Surgery

## 2022-06-27 DIAGNOSIS — G8929 Other chronic pain: Secondary | ICD-10-CM

## 2022-07-11 ENCOUNTER — Ambulatory Visit
Admission: RE | Admit: 2022-07-11 | Discharge: 2022-07-11 | Disposition: A | Discharge status: Home / Self Care | Payer: BC Managed Care – PPO | Source: Ambulatory Visit | Attending: Orthopedic Surgery | Admitting: Orthopedic Surgery

## 2022-07-11 DIAGNOSIS — G8929 Other chronic pain: Secondary | ICD-10-CM

## 2022-07-11 DIAGNOSIS — M25511 Pain in right shoulder: Secondary | ICD-10-CM | POA: Diagnosis not present

## 2022-07-17 DIAGNOSIS — M75101 Unspecified rotator cuff tear or rupture of right shoulder, not specified as traumatic: Secondary | ICD-10-CM | POA: Diagnosis not present

## 2022-08-10 DIAGNOSIS — M75111 Incomplete rotator cuff tear or rupture of right shoulder, not specified as traumatic: Secondary | ICD-10-CM | POA: Diagnosis not present

## 2022-08-10 DIAGNOSIS — M67813 Other specified disorders of tendon, right shoulder: Secondary | ICD-10-CM | POA: Diagnosis not present

## 2022-08-10 DIAGNOSIS — G8918 Other acute postprocedural pain: Secondary | ICD-10-CM | POA: Diagnosis not present

## 2022-08-10 DIAGNOSIS — M7521 Bicipital tendinitis, right shoulder: Secondary | ICD-10-CM | POA: Diagnosis not present

## 2022-08-10 DIAGNOSIS — S43431A Superior glenoid labrum lesion of right shoulder, initial encounter: Secondary | ICD-10-CM | POA: Diagnosis not present

## 2022-08-10 DIAGNOSIS — M65811 Other synovitis and tenosynovitis, right shoulder: Secondary | ICD-10-CM | POA: Diagnosis not present

## 2022-08-10 DIAGNOSIS — M75121 Complete rotator cuff tear or rupture of right shoulder, not specified as traumatic: Secondary | ICD-10-CM | POA: Diagnosis not present

## 2022-08-10 DIAGNOSIS — M25811 Other specified joint disorders, right shoulder: Secondary | ICD-10-CM | POA: Diagnosis not present

## 2022-08-10 DIAGNOSIS — M7591 Shoulder lesion, unspecified, right shoulder: Secondary | ICD-10-CM | POA: Diagnosis not present

## 2022-08-10 DIAGNOSIS — M7541 Impingement syndrome of right shoulder: Secondary | ICD-10-CM | POA: Diagnosis not present

## 2022-08-10 DIAGNOSIS — X58XXXA Exposure to other specified factors, initial encounter: Secondary | ICD-10-CM | POA: Diagnosis not present

## 2022-08-10 DIAGNOSIS — M19011 Primary osteoarthritis, right shoulder: Secondary | ICD-10-CM | POA: Diagnosis not present

## 2022-08-10 DIAGNOSIS — M7551 Bursitis of right shoulder: Secondary | ICD-10-CM | POA: Diagnosis not present

## 2022-08-22 DIAGNOSIS — M75101 Unspecified rotator cuff tear or rupture of right shoulder, not specified as traumatic: Secondary | ICD-10-CM | POA: Diagnosis not present

## 2022-08-22 DIAGNOSIS — Z9889 Other specified postprocedural states: Secondary | ICD-10-CM | POA: Diagnosis not present

## 2022-08-22 DIAGNOSIS — R6889 Other general symptoms and signs: Secondary | ICD-10-CM | POA: Diagnosis not present

## 2022-08-22 DIAGNOSIS — M25811 Other specified joint disorders, right shoulder: Secondary | ICD-10-CM | POA: Diagnosis not present

## 2022-08-22 DIAGNOSIS — R29898 Other symptoms and signs involving the musculoskeletal system: Secondary | ICD-10-CM | POA: Diagnosis not present

## 2022-08-22 DIAGNOSIS — M25611 Stiffness of right shoulder, not elsewhere classified: Secondary | ICD-10-CM | POA: Diagnosis not present

## 2022-08-29 DIAGNOSIS — R29898 Other symptoms and signs involving the musculoskeletal system: Secondary | ICD-10-CM | POA: Diagnosis not present

## 2022-08-29 DIAGNOSIS — M75101 Unspecified rotator cuff tear or rupture of right shoulder, not specified as traumatic: Secondary | ICD-10-CM | POA: Diagnosis not present

## 2022-08-29 DIAGNOSIS — R6889 Other general symptoms and signs: Secondary | ICD-10-CM | POA: Diagnosis not present

## 2022-08-29 DIAGNOSIS — M25611 Stiffness of right shoulder, not elsewhere classified: Secondary | ICD-10-CM | POA: Diagnosis not present

## 2022-09-06 DIAGNOSIS — R29898 Other symptoms and signs involving the musculoskeletal system: Secondary | ICD-10-CM | POA: Diagnosis not present

## 2022-09-06 DIAGNOSIS — M25611 Stiffness of right shoulder, not elsewhere classified: Secondary | ICD-10-CM | POA: Diagnosis not present

## 2022-09-06 DIAGNOSIS — R6889 Other general symptoms and signs: Secondary | ICD-10-CM | POA: Diagnosis not present

## 2022-09-06 DIAGNOSIS — Z9889 Other specified postprocedural states: Secondary | ICD-10-CM | POA: Diagnosis not present

## 2022-09-12 ENCOUNTER — Ambulatory Visit (INDEPENDENT_AMBULATORY_CARE_PROVIDER_SITE_OTHER): Payer: BC Managed Care – PPO | Admitting: Family Medicine

## 2022-09-12 ENCOUNTER — Encounter: Payer: Self-pay | Admitting: Family Medicine

## 2022-09-12 VITALS — BP 110/70 | HR 79 | Temp 98.0°F | Ht 66.0 in | Wt 176.0 lb

## 2022-09-12 DIAGNOSIS — Z0000 Encounter for general adult medical examination without abnormal findings: Secondary | ICD-10-CM

## 2022-09-12 DIAGNOSIS — G4730 Sleep apnea, unspecified: Secondary | ICD-10-CM

## 2022-09-12 LAB — CBC WITH DIFFERENTIAL/PLATELET
Basophils Absolute: 0.1 10*3/uL (ref 0.0–0.1)
Basophils Relative: 1.2 % (ref 0.0–3.0)
Eosinophils Absolute: 0.2 10*3/uL (ref 0.0–0.7)
Eosinophils Relative: 2.2 % (ref 0.0–5.0)
HCT: 55.7 % — ABNORMAL HIGH (ref 39.0–52.0)
Hemoglobin: 19 g/dL (ref 13.0–17.0)
Lymphocytes Relative: 14 % (ref 12.0–46.0)
Lymphs Abs: 1 10*3/uL (ref 0.7–4.0)
MCHC: 34.1 g/dL (ref 30.0–36.0)
MCV: 93.1 fl (ref 78.0–100.0)
Monocytes Absolute: 0.9 10*3/uL (ref 0.1–1.0)
Monocytes Relative: 12.9 % — ABNORMAL HIGH (ref 3.0–12.0)
Neutro Abs: 4.8 10*3/uL (ref 1.4–7.7)
Neutrophils Relative %: 69.7 % (ref 43.0–77.0)
Platelets: 151 10*3/uL (ref 150.0–400.0)
RBC: 5.98 Mil/uL — ABNORMAL HIGH (ref 4.22–5.81)
RDW: 14.1 % (ref 11.5–15.5)
WBC: 6.8 10*3/uL (ref 4.0–10.5)

## 2022-09-12 LAB — BASIC METABOLIC PANEL
BUN: 13 mg/dL (ref 6–23)
CO2: 27 mEq/L (ref 19–32)
Calcium: 9.3 mg/dL (ref 8.4–10.5)
Chloride: 101 mEq/L (ref 96–112)
Creatinine, Ser: 1.05 mg/dL (ref 0.40–1.50)
GFR: 77.86 mL/min (ref >60.00)
Glucose, Bld: 71 mg/dL (ref 70–99)
Potassium: 4.1 mEq/L (ref 3.5–5.1)
Sodium: 139 mEq/L (ref 135–145)

## 2022-09-12 LAB — LIPID PANEL
Cholesterol: 210 mg/dL — ABNORMAL HIGH (ref 0–200)
HDL: 40.2 mg/dL (ref >39.00)
LDL Cholesterol: 152 mg/dL — ABNORMAL HIGH (ref 0–99)
NonHDL: 169.84
Total CHOL/HDL Ratio: 5
Triglycerides: 88 mg/dL (ref 0.0–149.0)
VLDL: 17.6 mg/dL (ref 0.0–40.0)

## 2022-09-12 LAB — HEMOGLOBIN A1C: Hgb A1c MFr Bld: 5.4 % (ref 4.6–6.5)

## 2022-09-12 LAB — TSH: TSH: 2.44 u[IU]/mL (ref 0.35–5.50)

## 2022-09-12 LAB — HEPATIC FUNCTION PANEL
ALT: 26 U/L (ref 0–53)
AST: 17 U/L (ref 0–37)
Albumin: 4.4 g/dL (ref 3.5–5.2)
Alkaline Phosphatase: 58 U/L (ref 39–117)
Bilirubin, Direct: 0.3 mg/dL (ref 0.0–0.3)
Total Bilirubin: 1.8 mg/dL — ABNORMAL HIGH (ref 0.2–1.2)
Total Protein: 6.3 g/dL (ref 6.0–8.3)

## 2022-09-12 LAB — PSA: PSA: 0.56 ng/mL (ref 0.10–4.00)

## 2022-09-12 MED ORDER — METOPROLOL SUCCINATE ER 25 MG PO TB24: 25.0000 mg | ORAL_TABLET | Freq: Every day | ORAL | 3 refills | Status: DC

## 2022-09-12 MED ORDER — ZOLPIDEM TARTRATE 10 MG PO TABS: 20.0000 mg | ORAL_TABLET | Freq: Every day | ORAL | 5 refills | Status: DC

## 2022-09-12 MED ORDER — ROSUVASTATIN CALCIUM 20 MG PO TABS: 20.0000 mg | ORAL_TABLET | Freq: Every day | ORAL | 3 refills | Status: DC

## 2022-09-12 NOTE — Progress Notes (Signed)
Subjective:    Patient ID: [name], male    DOB: [birth year], 61 y.o.   MRN: [id number]  HPI Here for a well exam. He feels well. His wife tells him he snores loudly and he stops breathing while he is sleeping.    Review of Systems  Constitutional: Negative.   HENT: Negative.    Eyes: Negative.   Respiratory: Negative.    Cardiovascular: Negative.   Gastrointestinal: Negative.   Genitourinary: Negative.   Musculoskeletal: Negative.   Skin: Negative.   Neurological: Negative.   Psychiatric/Behavioral: Negative.         Objective:   Physical Exam Constitutional:      General: He is not in acute distress.    Appearance: Normal appearance. He is well-developed. He is not diaphoretic.  HENT:     Head: Normocephalic and atraumatic.     Right Ear: External ear normal.     Left Ear: External ear normal.     Nose: Nose normal.     Mouth/Throat:     Pharynx: No oropharyngeal exudate.  Eyes:     General: No scleral icterus.       Right eye: No discharge.        Left eye: No discharge.     Conjunctiva/sclera: Conjunctivae normal.     Pupils: Pupils are equal, round, and reactive to light.  Neck:     Thyroid: No thyromegaly.     Vascular: No JVD.     Trachea: No tracheal deviation.  Cardiovascular:     Rate and Rhythm: Normal rate and regular rhythm.     Heart sounds: Normal heart sounds. No murmur heard.    No friction rub. No gallop.  Pulmonary:     Effort: Pulmonary effort is normal. No respiratory distress.     Breath sounds: Normal breath sounds. No wheezing or rales.  Chest:     Chest wall: No tenderness.  Abdominal:     General: Bowel sounds are normal. There is no distension.     Palpations: Abdomen is soft. There is no mass.     Tenderness: There is no abdominal tenderness. There is no guarding or rebound.  Genitourinary:    Penis: Normal. No tenderness.      Testes: Normal.     Prostate: Normal.     Rectum: Normal. Guaiac result negative.   Musculoskeletal:        General: No tenderness. Normal range of motion.     Cervical back: Neck supple.  Lymphadenopathy:     Cervical: No cervical adenopathy.  Skin:    General: Skin is warm and dry.     Coloration: Skin is not pale.     Findings: No erythema or rash.  Neurological:     Mental Status: He is alert and oriented to person, place, and time.     Cranial Nerves: No cranial nerve deficit.     Motor: No abnormal muscle tone.     Coordination: Coordination normal.     Deep Tendon Reflexes: Reflexes are normal and symmetric. Reflexes normal.  Psychiatric:        Behavior: Behavior normal.        Thought Content: Thought content normal.        Judgment: Judgment normal.           Assessment & Plan:  Well exam. We discussed diet and exercise. Get fasting labs. Refer for a colonoscopy. Refer to pulmonology for sleep apnea.  Alysia Penna, MD

## 2022-09-13 DIAGNOSIS — M25611 Stiffness of right shoulder, not elsewhere classified: Secondary | ICD-10-CM | POA: Diagnosis not present

## 2022-09-13 DIAGNOSIS — R6889 Other general symptoms and signs: Secondary | ICD-10-CM | POA: Diagnosis not present

## 2022-09-13 DIAGNOSIS — R29898 Other symptoms and signs involving the musculoskeletal system: Secondary | ICD-10-CM | POA: Diagnosis not present

## 2022-09-13 DIAGNOSIS — Z9889 Other specified postprocedural states: Secondary | ICD-10-CM | POA: Diagnosis not present

## 2022-09-20 DIAGNOSIS — R29898 Other symptoms and signs involving the musculoskeletal system: Secondary | ICD-10-CM | POA: Diagnosis not present

## 2022-09-20 DIAGNOSIS — R6889 Other general symptoms and signs: Secondary | ICD-10-CM | POA: Diagnosis not present

## 2022-09-20 DIAGNOSIS — Z9889 Other specified postprocedural states: Secondary | ICD-10-CM | POA: Diagnosis not present

## 2022-09-20 DIAGNOSIS — M25611 Stiffness of right shoulder, not elsewhere classified: Secondary | ICD-10-CM | POA: Diagnosis not present

## 2022-09-27 DIAGNOSIS — Z9889 Other specified postprocedural states: Secondary | ICD-10-CM | POA: Diagnosis not present

## 2022-09-27 DIAGNOSIS — M25611 Stiffness of right shoulder, not elsewhere classified: Secondary | ICD-10-CM | POA: Diagnosis not present

## 2022-09-27 DIAGNOSIS — R6889 Other general symptoms and signs: Secondary | ICD-10-CM | POA: Diagnosis not present

## 2022-09-27 DIAGNOSIS — R29898 Other symptoms and signs involving the musculoskeletal system: Secondary | ICD-10-CM | POA: Diagnosis not present

## 2022-10-05 DIAGNOSIS — Z9889 Other specified postprocedural states: Secondary | ICD-10-CM | POA: Diagnosis not present

## 2022-10-05 DIAGNOSIS — R6889 Other general symptoms and signs: Secondary | ICD-10-CM | POA: Diagnosis not present

## 2022-10-05 DIAGNOSIS — M25611 Stiffness of right shoulder, not elsewhere classified: Secondary | ICD-10-CM | POA: Diagnosis not present

## 2022-10-05 DIAGNOSIS — R29898 Other symptoms and signs involving the musculoskeletal system: Secondary | ICD-10-CM | POA: Diagnosis not present

## 2022-10-18 DIAGNOSIS — Z9889 Other specified postprocedural states: Secondary | ICD-10-CM | POA: Diagnosis not present

## 2022-10-18 DIAGNOSIS — M25611 Stiffness of right shoulder, not elsewhere classified: Secondary | ICD-10-CM | POA: Diagnosis not present

## 2022-10-18 DIAGNOSIS — R6889 Other general symptoms and signs: Secondary | ICD-10-CM | POA: Diagnosis not present

## 2022-10-18 DIAGNOSIS — R29898 Other symptoms and signs involving the musculoskeletal system: Secondary | ICD-10-CM | POA: Diagnosis not present

## 2022-10-25 DIAGNOSIS — M25611 Stiffness of right shoulder, not elsewhere classified: Secondary | ICD-10-CM | POA: Diagnosis not present

## 2022-10-25 DIAGNOSIS — R29898 Other symptoms and signs involving the musculoskeletal system: Secondary | ICD-10-CM | POA: Diagnosis not present

## 2022-10-25 DIAGNOSIS — Z9889 Other specified postprocedural states: Secondary | ICD-10-CM | POA: Diagnosis not present

## 2022-10-25 DIAGNOSIS — R6889 Other general symptoms and signs: Secondary | ICD-10-CM | POA: Diagnosis not present

## 2022-10-30 ENCOUNTER — Ambulatory Visit (INDEPENDENT_AMBULATORY_CARE_PROVIDER_SITE_OTHER): Payer: BC Managed Care – PPO | Admitting: Pulmonary Disease

## 2022-10-30 ENCOUNTER — Encounter: Payer: Self-pay | Admitting: Pulmonary Disease

## 2022-10-30 VITALS — BP 112/84 | HR 84 | Ht 66.0 in | Wt 180.4 lb

## 2022-10-30 DIAGNOSIS — Z8669 Personal history of other diseases of the nervous system and sense organs: Secondary | ICD-10-CM

## 2022-10-30 MED ORDER — ZOLPIDEM TARTRATE ER 12.5 MG PO TBCR: 12.5000 mg | EXTENDED_RELEASE_TABLET | Freq: Every evening | ORAL | 3 refills | Status: DC | PRN

## 2022-10-30 NOTE — Patient Instructions (Addendum)
Prescription for Ambien 12.5 -May help difficulty staying asleep compared to straightforward Ambien  We will schedule you for home sleep study to assess for severity of sleep disordered breathing  Tentative follow-up in 3 months

## 2022-10-30 NOTE — Progress Notes (Signed)
[name]    [id number]    [birth year]  Primary Care Physician:Fry, Ishmael Holter, MD  Referring Physician: Laurey Morale, MD [address] 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  Diagnosed with moderate obstructive sleep apnea in 2012 Used BiPAP for about 6 months, CPAP for a few months before quitting  Usually tries to go to bed about 930, takes him about 30 minutes to fall asleep 2-3 awakenings Final wake up time about 5 AM Does not feel restored in the morning  Denies any choking episodes at night Has a lot of nasal stuffiness and congestion  He does snore, does have witnessed apneas  History of hypercholesterolemia Denies any significant headaches in the morning, although sometimes with distress he does get headaches  No OSA in the family  Non-smoker  Outpatient Encounter Medications as of 10/30/2022  Medication Sig   aspirin EC 81 MG tablet Take 1 tablet (81 mg total) by mouth daily.   metoprolol succinate (TOPROL XL) 25 MG 24 hr tablet Take 1 tablet (25 mg total) by mouth daily.   rosuvastatin (CRESTOR) 20 MG tablet Take 1 tablet (20 mg total) by mouth daily.   zolpidem (AMBIEN CR) 12.5 MG CR tablet Take 1 tablet (12.5 mg total) by mouth at bedtime as needed for sleep.   [DISCONTINUED] zolpidem (AMBIEN) 10 MG tablet Take 2 tablets (20 mg total) by mouth at bedtime.   No  facility-administered encounter medications on file as of 10/30/2022.    Allergies as of 10/30/2022 - Review Complete 09/12/2022  Allergen Reaction Noted   Hydrocodone Hives 12/10/2015   Zetia [ezetimibe] Rash 12/05/2010    Past Medical History:  Diagnosis Date   Allergy    History of nephrolithiasis    Hyperlipidemia    Hypertension    Sleep apnea     Past Surgical History:  Procedure Laterality Date   APPENDECTOMY     IRRIGATION AND DEBRIDEMENT ABSCESS Left 12/13/2015   Procedure: IRRIGATION AND DEBRIDEMENT LEFT GLUTEAL HEMATOMA;  Surgeon: Leighton Ruff, MD;  Location: [facility];  Service: General;  Laterality: Left;   LUMBAR LAMINECTOMY     UVULECTOMY      Family History  Problem Relation Age of Onset   Emphysema Mother    Asthma Mother    Heart disease Father 55   Hyperlipidemia Other        family  hx   Hypertension Other        family hx   Stroke Other        family hx   Sudden death Other        family hx    Social History   Socioeconomic History   Marital status: Married    Spouse name: Not on file   Number of children: 3   Years of education: Not on file  Highest education level: Associate degree: academic program  Occupational History   Not on file  Tobacco Use   Smoking status: Never   Smokeless tobacco: Never  Substance and Sexual Activity   Alcohol use: Yes    Alcohol/week: 0.0 standard drinks of alcohol    Comment: occ   Drug use: No   Sexual activity: Not on file  Other Topics Concern   Not on file  Social History Narrative   Not on file   Social Determinants of Health   Financial Resource Strain: Low Risk  (11/11/2021)   Overall Financial Resource Strain (CARDIA)    Difficulty of Paying Living Expenses: Not hard at all  Food Insecurity: No Food Insecurity (11/11/2021)   Hunger Vital Sign    Worried About Running Out of Food in the Last Year: Never true    Ran Out of Food in the Last Year: Never true  Transportation Needs: No  Transportation Needs (11/11/2021)   PRAPARE - Hydrologist (Medical): No    Lack of Transportation (Non-Medical): No  Physical Activity: Sufficiently Active (11/11/2021)   Exercise Vital Sign    Days of Exercise per Week: 5 days    Minutes of Exercise per Session: 50 min  Stress: No Stress Concern Present (11/11/2021)   Zeigler    Feeling of Stress : Not at all  Social Connections: Moderately Isolated (11/11/2021)   Social Connection and Isolation Panel [NHANES]    Frequency of Communication with Friends and Family: Three times a week    Frequency of Social Gatherings with Friends and Family: More than three times a week    Attends Religious Services: Never    Marine scientist or Organizations: No    Attends Music therapist: Not on file    Marital Status: Married  Human resources officer Violence: Not on file    Review of Systems  Constitutional:  Positive for fatigue.  Respiratory:  Positive for apnea.   Psychiatric/Behavioral:  Positive for sleep disturbance.     Vitals:   10/30/22 1105  BP: 112/84  Pulse: 84  SpO2: 95%     Physical Exam Constitutional:      Appearance: Normal appearance.  HENT:     Head: Normocephalic.     Mouth/Throat:     Mouth: Mucous membranes are moist.  Eyes:     Pupils: Pupils are equal, round, and reactive to light.  Cardiovascular:     Rate and Rhythm: Normal rate and regular rhythm.     Heart sounds: No murmur heard.    No friction rub.  Pulmonary:     Effort: No respiratory distress.     Breath sounds: No stridor. No wheezing or rhonchi.  Musculoskeletal:     Cervical back: No rigidity or tenderness.  Neurological:     Mental Status: He is alert.  Psychiatric:        Mood and Affect: Mood normal.      10/30/2022   11:00 AM  Results of the Epworth flowsheet  Sitting and reading 2  Watching TV 1  Sitting, inactive in a  public place (e.g. a theatre or a meeting) 1  As a passenger in a car for an hour without a break 0  Lying down to rest in the afternoon when circumstances permit 1  Sitting and talking to someone 0  Sitting quietly after a lunch without alcohol 0  In a car,  while stopped for a few minutes in traffic 0  Total score 5     Data Reviewed: Previous sleep study shows moderate obstructive sleep apnea  Assessment:  Moderate obstructive sleep apnea  Nonrestorative sleep  Sleep maintenance insomnia  Currently uses 20 mg of Ambien, still wakes up frequently  Weight has been stable compared to a few years back  Plan/Recommendations: Schedule patient for a home sleep study to evaluate for severity of sleep disordered breathing  He is wanting to figure out how severe his sleep apnea is and possibly treated with CPAP therapy if able  We did talk about trying different medications for his insomnia -12.5 of Ambien may help better with sleep maintenance  -Open to trying a different agent if this does not help  Behavioral modifications to help sleep quality likely needs to be made  Follow-up in about 3 months  Encouraged to call with significant concerns   Sherrilyn Rist MD [facility] 10/30/2022, 11:27 AM  CC: Laurey Morale, MD

## 2022-11-01 DIAGNOSIS — R6889 Other general symptoms and signs: Secondary | ICD-10-CM | POA: Diagnosis not present

## 2022-11-01 DIAGNOSIS — Z9889 Other specified postprocedural states: Secondary | ICD-10-CM | POA: Diagnosis not present

## 2022-11-01 DIAGNOSIS — M25611 Stiffness of right shoulder, not elsewhere classified: Secondary | ICD-10-CM | POA: Diagnosis not present

## 2022-11-01 DIAGNOSIS — R29898 Other symptoms and signs involving the musculoskeletal system: Secondary | ICD-10-CM | POA: Diagnosis not present

## 2022-12-13 ENCOUNTER — Telehealth: Payer: Self-pay | Admitting: Pulmonary Disease

## 2022-12-13 ENCOUNTER — Telehealth: Payer: Self-pay | Admitting: Family Medicine

## 2022-12-13 NOTE — Telephone Encounter (Signed)
Pt called to say he had a sleep study back in early February, and has yet to hear back from them.  Pt is asking for a call back to discuss.

## 2022-12-13 NOTE — Telephone Encounter (Signed)
I called the pulmonary office at [phone] and spoke with Maudie Mercury as I do not see a report in the chart.   Maudie Mercury stated she will enter a phone note to have someone from their office contact the patient regarding the results.  I left a detailed message at the patient's cell number to expect a call from the pulmonary office discuss the results.

## 2022-12-13 NOTE — Telephone Encounter (Signed)
Mechele Claude would like our office to call patient with sleep test results. Patient phone number is [phone]. Mechele Claude phone number is [phone].

## 2022-12-14 NOTE — Telephone Encounter (Signed)
This order has been sent to [facility] on 2/22 looks like they are still working with his insurance to get the [place]

## 2022-12-14 NOTE — Telephone Encounter (Signed)
Looked in patient's chart, I do not see a HST.   PCCs, please advise. Thanks!

## 2022-12-19 ENCOUNTER — Telehealth: Payer: Self-pay | Admitting: Family Medicine

## 2022-12-19 NOTE — Telephone Encounter (Signed)
Calling to speak with [name] regarding previous sleep study. Says he has not heard from the provider and spoke with [name] regarding this last week.

## 2022-12-21 NOTE — Telephone Encounter (Signed)
Spoke with pt advised to call [facility] to schedule appointment for the sleep study, verbalized understanding

## 2022-12-28 DIAGNOSIS — F411 Generalized anxiety disorder: Secondary | ICD-10-CM | POA: Diagnosis not present

## 2023-01-03 ENCOUNTER — Telehealth: Payer: Self-pay | Admitting: Pulmonary Disease

## 2023-01-03 NOTE — Telephone Encounter (Signed)
Dr. Jenetta Downer can you please advise if patient is supposed to have two different mg of Ambien. Pharmacy has CR and regular

## 2023-01-07 NOTE — Telephone Encounter (Signed)
Only supposed to have Ambien CR to help sleep maitenance 12.5

## 2023-01-08 ENCOUNTER — Other Ambulatory Visit: Payer: Self-pay | Admitting: Pulmonary Disease

## 2023-01-08 NOTE — Telephone Encounter (Signed)
Called [facility] pharmacy and the pharmacist stated that another doctor sent in zolpidem  (AMBIEN) XR for pt at another [facility] pharmacy and pharmacist just wanted to make [place] aware. Because Dr Wynona Neat prescribed zolpidem (AMBIEN CR) 12.5 MG for pt. I verbalized understanding and informed the pharmacist I will make a note in the pt's chart so if pt called for a refill the pt will be informed we did not prescribe the XR for him.

## 2023-01-10 DIAGNOSIS — F411 Generalized anxiety disorder: Secondary | ICD-10-CM | POA: Diagnosis not present

## 2023-01-11 ENCOUNTER — Telehealth: Payer: Self-pay | Admitting: Pulmonary Disease

## 2023-01-11 ENCOUNTER — Telehealth: Payer: Self-pay

## 2023-01-11 NOTE — Telephone Encounter (Signed)
Patient checking on scheduling for home sleep test. Has not heard from anyone. Patient phone number is [phone].

## 2023-01-11 NOTE — Telephone Encounter (Signed)
Called and spoke with pt who states he needs to have his [place] refilled sent to [facility]. Dr. Val Eagle, please advise.

## 2023-01-11 NOTE — Telephone Encounter (Signed)
Please advise on refill request

## 2023-01-11 NOTE — Telephone Encounter (Signed)
Error

## 2023-01-11 NOTE — Telephone Encounter (Signed)
Patient states needs refill for Ambien. Pharmacy is [facility]. Patient phone number is [phone].

## 2023-01-12 ENCOUNTER — Other Ambulatory Visit: Payer: Self-pay | Admitting: Pulmonary Disease

## 2023-01-12 MED ORDER — ZOLPIDEM TARTRATE ER 12.5 MG PO TBCR: EXTENDED_RELEASE_TABLET | ORAL | 3 refills | Status: DC

## 2023-01-16 NOTE — Telephone Encounter (Signed)
Spoke to pt and made him aware [facility] has tried to contact him and gave him phone # [phone] to call them.  Nothing further needed.

## 2023-01-17 DIAGNOSIS — F411 Generalized anxiety disorder: Secondary | ICD-10-CM | POA: Diagnosis not present

## 2023-01-18 NOTE — Telephone Encounter (Signed)
Checked pt's chart and saw that the Remus Loffler was refilled for pt 01/12/23. Attempted to call pt but unable to reach. Left him a detailed message letting him know that the Rx was refilled and stated to pt if there were still issues with him getting this for him to call the office. Nothing further needed.

## 2023-01-19 DIAGNOSIS — M545 Low back pain, unspecified: Secondary | ICD-10-CM | POA: Diagnosis not present

## 2023-01-19 DIAGNOSIS — G8929 Other chronic pain: Secondary | ICD-10-CM | POA: Diagnosis not present

## 2023-01-31 DIAGNOSIS — F411 Generalized anxiety disorder: Secondary | ICD-10-CM | POA: Diagnosis not present

## 2023-02-07 DIAGNOSIS — G8929 Other chronic pain: Secondary | ICD-10-CM | POA: Diagnosis not present

## 2023-02-07 DIAGNOSIS — M545 Low back pain, unspecified: Secondary | ICD-10-CM | POA: Diagnosis not present

## 2023-02-07 DIAGNOSIS — M4316 Spondylolisthesis, lumbar region: Secondary | ICD-10-CM | POA: Diagnosis not present

## 2023-02-08 DIAGNOSIS — F411 Generalized anxiety disorder: Secondary | ICD-10-CM | POA: Diagnosis not present

## 2023-02-08 DIAGNOSIS — M5416 Radiculopathy, lumbar region: Secondary | ICD-10-CM | POA: Diagnosis not present

## 2023-02-08 DIAGNOSIS — G8929 Other chronic pain: Secondary | ICD-10-CM | POA: Diagnosis not present

## 2023-02-14 DIAGNOSIS — M5416 Radiculopathy, lumbar region: Secondary | ICD-10-CM | POA: Diagnosis not present

## 2023-02-14 DIAGNOSIS — G8929 Other chronic pain: Secondary | ICD-10-CM | POA: Diagnosis not present

## 2023-02-14 DIAGNOSIS — F900 Attention-deficit hyperactivity disorder, predominantly inattentive type: Secondary | ICD-10-CM | POA: Diagnosis not present

## 2023-02-14 DIAGNOSIS — F413 Other mixed anxiety disorders: Secondary | ICD-10-CM | POA: Diagnosis not present

## 2023-02-14 DIAGNOSIS — Z5181 Encounter for therapeutic drug level monitoring: Secondary | ICD-10-CM | POA: Diagnosis not present

## 2023-02-14 DIAGNOSIS — F411 Generalized anxiety disorder: Secondary | ICD-10-CM | POA: Diagnosis not present

## 2023-03-01 DIAGNOSIS — F900 Attention-deficit hyperactivity disorder, predominantly inattentive type: Secondary | ICD-10-CM | POA: Diagnosis not present

## 2023-03-01 DIAGNOSIS — F411 Generalized anxiety disorder: Secondary | ICD-10-CM | POA: Diagnosis not present

## 2023-03-08 DIAGNOSIS — G8929 Other chronic pain: Secondary | ICD-10-CM | POA: Diagnosis not present

## 2023-03-08 DIAGNOSIS — F413 Other mixed anxiety disorders: Secondary | ICD-10-CM | POA: Diagnosis not present

## 2023-03-08 DIAGNOSIS — M5416 Radiculopathy, lumbar region: Secondary | ICD-10-CM | POA: Diagnosis not present

## 2023-03-08 DIAGNOSIS — F411 Generalized anxiety disorder: Secondary | ICD-10-CM | POA: Diagnosis not present

## 2023-03-21 DIAGNOSIS — G8929 Other chronic pain: Secondary | ICD-10-CM | POA: Diagnosis not present

## 2023-03-21 DIAGNOSIS — F900 Attention-deficit hyperactivity disorder, predominantly inattentive type: Secondary | ICD-10-CM | POA: Diagnosis not present

## 2023-03-21 DIAGNOSIS — M5416 Radiculopathy, lumbar region: Secondary | ICD-10-CM | POA: Diagnosis not present

## 2023-03-22 DIAGNOSIS — F900 Attention-deficit hyperactivity disorder, predominantly inattentive type: Secondary | ICD-10-CM | POA: Diagnosis not present

## 2023-03-22 DIAGNOSIS — F411 Generalized anxiety disorder: Secondary | ICD-10-CM | POA: Diagnosis not present

## 2023-03-29 DIAGNOSIS — F411 Generalized anxiety disorder: Secondary | ICD-10-CM | POA: Diagnosis not present

## 2023-03-29 DIAGNOSIS — F900 Attention-deficit hyperactivity disorder, predominantly inattentive type: Secondary | ICD-10-CM | POA: Diagnosis not present

## 2023-04-04 DIAGNOSIS — F900 Attention-deficit hyperactivity disorder, predominantly inattentive type: Secondary | ICD-10-CM | POA: Diagnosis not present

## 2023-04-04 DIAGNOSIS — F411 Generalized anxiety disorder: Secondary | ICD-10-CM | POA: Diagnosis not present

## 2023-04-19 DIAGNOSIS — F411 Generalized anxiety disorder: Secondary | ICD-10-CM | POA: Diagnosis not present

## 2023-04-19 DIAGNOSIS — F900 Attention-deficit hyperactivity disorder, predominantly inattentive type: Secondary | ICD-10-CM | POA: Diagnosis not present

## 2023-04-26 DIAGNOSIS — F411 Generalized anxiety disorder: Secondary | ICD-10-CM | POA: Diagnosis not present

## 2023-04-26 DIAGNOSIS — F900 Attention-deficit hyperactivity disorder, predominantly inattentive type: Secondary | ICD-10-CM | POA: Diagnosis not present

## 2023-05-03 DIAGNOSIS — F900 Attention-deficit hyperactivity disorder, predominantly inattentive type: Secondary | ICD-10-CM | POA: Diagnosis not present

## 2023-05-03 DIAGNOSIS — F411 Generalized anxiety disorder: Secondary | ICD-10-CM | POA: Diagnosis not present

## 2023-05-09 DIAGNOSIS — F900 Attention-deficit hyperactivity disorder, predominantly inattentive type: Secondary | ICD-10-CM | POA: Diagnosis not present

## 2023-05-09 DIAGNOSIS — F411 Generalized anxiety disorder: Secondary | ICD-10-CM | POA: Diagnosis not present

## 2023-05-18 DIAGNOSIS — F411 Generalized anxiety disorder: Secondary | ICD-10-CM | POA: Diagnosis not present

## 2023-05-18 DIAGNOSIS — F900 Attention-deficit hyperactivity disorder, predominantly inattentive type: Secondary | ICD-10-CM | POA: Diagnosis not present

## 2023-05-23 DIAGNOSIS — F411 Generalized anxiety disorder: Secondary | ICD-10-CM | POA: Diagnosis not present

## 2023-05-23 DIAGNOSIS — F900 Attention-deficit hyperactivity disorder, predominantly inattentive type: Secondary | ICD-10-CM | POA: Diagnosis not present

## 2023-05-31 DIAGNOSIS — F411 Generalized anxiety disorder: Secondary | ICD-10-CM | POA: Diagnosis not present

## 2023-05-31 DIAGNOSIS — F900 Attention-deficit hyperactivity disorder, predominantly inattentive type: Secondary | ICD-10-CM | POA: Diagnosis not present

## 2023-06-05 DIAGNOSIS — F900 Attention-deficit hyperactivity disorder, predominantly inattentive type: Secondary | ICD-10-CM | POA: Diagnosis not present

## 2023-06-05 DIAGNOSIS — F411 Generalized anxiety disorder: Secondary | ICD-10-CM | POA: Diagnosis not present

## 2023-06-14 DIAGNOSIS — F900 Attention-deficit hyperactivity disorder, predominantly inattentive type: Secondary | ICD-10-CM | POA: Diagnosis not present

## 2023-06-14 DIAGNOSIS — F411 Generalized anxiety disorder: Secondary | ICD-10-CM | POA: Diagnosis not present

## 2023-06-21 DIAGNOSIS — F411 Generalized anxiety disorder: Secondary | ICD-10-CM | POA: Diagnosis not present

## 2023-06-21 DIAGNOSIS — F900 Attention-deficit hyperactivity disorder, predominantly inattentive type: Secondary | ICD-10-CM | POA: Diagnosis not present

## 2023-06-22 DIAGNOSIS — F900 Attention-deficit hyperactivity disorder, predominantly inattentive type: Secondary | ICD-10-CM | POA: Diagnosis not present

## 2023-06-22 DIAGNOSIS — Z6828 Body mass index (BMI) 28.0-28.9, adult: Secondary | ICD-10-CM | POA: Diagnosis not present

## 2023-06-22 DIAGNOSIS — F411 Generalized anxiety disorder: Secondary | ICD-10-CM | POA: Diagnosis not present

## 2023-06-22 DIAGNOSIS — M5416 Radiculopathy, lumbar region: Secondary | ICD-10-CM | POA: Diagnosis not present

## 2023-07-10 DIAGNOSIS — M5116 Intervertebral disc disorders with radiculopathy, lumbar region: Secondary | ICD-10-CM | POA: Diagnosis not present

## 2023-07-10 DIAGNOSIS — M5117 Intervertebral disc disorders with radiculopathy, lumbosacral region: Secondary | ICD-10-CM | POA: Diagnosis not present

## 2023-07-10 DIAGNOSIS — M5416 Radiculopathy, lumbar region: Secondary | ICD-10-CM | POA: Diagnosis not present

## 2023-07-19 DIAGNOSIS — F900 Attention-deficit hyperactivity disorder, predominantly inattentive type: Secondary | ICD-10-CM | POA: Diagnosis not present

## 2023-07-19 DIAGNOSIS — F411 Generalized anxiety disorder: Secondary | ICD-10-CM | POA: Diagnosis not present

## 2023-07-26 DIAGNOSIS — F900 Attention-deficit hyperactivity disorder, predominantly inattentive type: Secondary | ICD-10-CM | POA: Diagnosis not present

## 2023-07-26 DIAGNOSIS — F411 Generalized anxiety disorder: Secondary | ICD-10-CM | POA: Diagnosis not present

## 2023-08-09 DIAGNOSIS — F411 Generalized anxiety disorder: Secondary | ICD-10-CM | POA: Diagnosis not present

## 2023-09-19 DIAGNOSIS — M961 Postlaminectomy syndrome, not elsewhere classified: Secondary | ICD-10-CM | POA: Diagnosis not present

## 2023-09-19 DIAGNOSIS — M48061 Spinal stenosis, lumbar region without neurogenic claudication: Secondary | ICD-10-CM | POA: Diagnosis not present

## 2023-09-19 DIAGNOSIS — M47816 Spondylosis without myelopathy or radiculopathy, lumbar region: Secondary | ICD-10-CM | POA: Diagnosis not present

## 2023-09-19 DIAGNOSIS — Z9889 Other specified postprocedural states: Secondary | ICD-10-CM | POA: Diagnosis not present

## 2023-10-16 DIAGNOSIS — F411 Generalized anxiety disorder: Secondary | ICD-10-CM | POA: Diagnosis not present

## 2023-10-17 NOTE — Telephone Encounter (Signed)
 Pt called to remind them of upcoming procedure at [facility] with Dr. Catherene on 10/26/23 at 1PM. Pt reminded to arrive 15 minutes early, wear loose fitting clothing and to bring a driver. Pt stated he/she is not taking blood thinners or ABX for an active infection. Told pt ok to eat and drink and take medications as normal. Advised pt if he/she should experience any flu like symptoms or develop infection requiring ABX to call to reschedule appt. Pt informed to call [phone] if any unforseen circumstances should arise. Pt verbalized understanding.

## 2023-10-19 DIAGNOSIS — Z6826 Body mass index (BMI) 26.0-26.9, adult: Secondary | ICD-10-CM | POA: Diagnosis not present

## 2023-10-19 DIAGNOSIS — M5416 Radiculopathy, lumbar region: Secondary | ICD-10-CM | POA: Diagnosis not present

## 2023-10-19 DIAGNOSIS — M4316 Spondylolisthesis, lumbar region: Secondary | ICD-10-CM | POA: Diagnosis not present

## 2023-10-23 DIAGNOSIS — M47816 Spondylosis without myelopathy or radiculopathy, lumbar region: Secondary | ICD-10-CM | POA: Diagnosis not present

## 2023-10-23 DIAGNOSIS — M4316 Spondylolisthesis, lumbar region: Secondary | ICD-10-CM | POA: Diagnosis not present

## 2023-10-23 DIAGNOSIS — R531 Weakness: Secondary | ICD-10-CM | POA: Diagnosis not present

## 2023-10-23 DIAGNOSIS — M4807 Spinal stenosis, lumbosacral region: Secondary | ICD-10-CM | POA: Diagnosis not present

## 2023-10-23 DIAGNOSIS — M48061 Spinal stenosis, lumbar region without neurogenic claudication: Secondary | ICD-10-CM | POA: Diagnosis not present

## 2023-10-25 DIAGNOSIS — F411 Generalized anxiety disorder: Secondary | ICD-10-CM | POA: Diagnosis not present

## 2023-10-30 DIAGNOSIS — M4316 Spondylolisthesis, lumbar region: Secondary | ICD-10-CM | POA: Diagnosis not present

## 2023-10-30 DIAGNOSIS — Z6826 Body mass index (BMI) 26.0-26.9, adult: Secondary | ICD-10-CM | POA: Diagnosis not present

## 2023-10-31 ENCOUNTER — Encounter: Payer: Self-pay | Admitting: Pulmonary Disease

## 2023-10-31 ENCOUNTER — Ambulatory Visit: Payer: BC Managed Care – PPO | Admitting: Pulmonary Disease

## 2023-11-08 DIAGNOSIS — M4316 Spondylolisthesis, lumbar region: Secondary | ICD-10-CM | POA: Diagnosis not present

## 2023-12-05 DIAGNOSIS — M4316 Spondylolisthesis, lumbar region: Secondary | ICD-10-CM | POA: Diagnosis not present

## 2023-12-05 DIAGNOSIS — Z6827 Body mass index (BMI) 27.0-27.9, adult: Secondary | ICD-10-CM | POA: Diagnosis not present

## 2023-12-11 DIAGNOSIS — F411 Generalized anxiety disorder: Secondary | ICD-10-CM | POA: Diagnosis not present

## 2023-12-18 DIAGNOSIS — F411 Generalized anxiety disorder: Secondary | ICD-10-CM | POA: Diagnosis not present

## 2024-01-16 DIAGNOSIS — M4316 Spondylolisthesis, lumbar region: Secondary | ICD-10-CM | POA: Diagnosis not present

## 2024-01-16 DIAGNOSIS — Z6826 Body mass index (BMI) 26.0-26.9, adult: Secondary | ICD-10-CM | POA: Diagnosis not present

## 2024-02-27 ENCOUNTER — Ambulatory Visit (INDEPENDENT_AMBULATORY_CARE_PROVIDER_SITE_OTHER): Admitting: Family Medicine

## 2024-02-27 ENCOUNTER — Telehealth: Payer: Self-pay

## 2024-02-27 ENCOUNTER — Ambulatory Visit: Payer: Self-pay | Admitting: Family Medicine

## 2024-02-27 ENCOUNTER — Encounter: Payer: Self-pay | Admitting: Family Medicine

## 2024-02-27 VITALS — BP 110/86 | HR 91 | Temp 98.3°F | Wt 165.2 lb

## 2024-02-27 DIAGNOSIS — N138 Other obstructive and reflux uropathy: Secondary | ICD-10-CM

## 2024-02-27 DIAGNOSIS — Z0000 Encounter for general adult medical examination without abnormal findings: Secondary | ICD-10-CM

## 2024-02-27 DIAGNOSIS — F900 Attention-deficit hyperactivity disorder, predominantly inattentive type: Secondary | ICD-10-CM | POA: Diagnosis not present

## 2024-02-27 DIAGNOSIS — N401 Enlarged prostate with lower urinary tract symptoms: Secondary | ICD-10-CM

## 2024-02-27 LAB — HEPATIC FUNCTION PANEL
ALT: 32 U/L (ref 0–53)
AST: 25 U/L (ref 0–37)
Albumin: 4.3 g/dL (ref 3.5–5.2)
Alkaline Phosphatase: 60 U/L (ref 39–117)
Bilirubin, Direct: 0.2 mg/dL (ref 0.0–0.3)
Total Bilirubin: 1.3 mg/dL — ABNORMAL HIGH (ref 0.2–1.2)
Total Protein: 6.2 g/dL (ref 6.0–8.3)

## 2024-02-27 LAB — HEMOGLOBIN A1C: Hgb A1c MFr Bld: 5.2 % (ref 4.6–6.5)

## 2024-02-27 LAB — CBC WITH DIFFERENTIAL/PLATELET
Basophils Absolute: 0.1 10*3/uL (ref 0.0–0.1)
Basophils Relative: 0.7 % (ref 0.0–3.0)
Eosinophils Absolute: 0.1 10*3/uL (ref 0.0–0.7)
Eosinophils Relative: 0.7 % (ref 0.0–5.0)
HCT: 56.7 % — ABNORMAL HIGH (ref 39.0–52.0)
Hemoglobin: 19.3 g/dL (ref 13.0–17.0)
Lymphocytes Relative: 12.8 % (ref 12.0–46.0)
Lymphs Abs: 1.2 10*3/uL (ref 0.7–4.0)
MCHC: 34 g/dL (ref 30.0–36.0)
MCV: 94.9 fl (ref 78.0–100.0)
Monocytes Absolute: 1.1 10*3/uL — ABNORMAL HIGH (ref 0.1–1.0)
Monocytes Relative: 11.3 % (ref 3.0–12.0)
Neutro Abs: 7 10*3/uL (ref 1.4–7.7)
Neutrophils Relative %: 74.5 % (ref 43.0–77.0)
Platelets: 189 10*3/uL (ref 150.0–400.0)
RBC: 5.97 Mil/uL — ABNORMAL HIGH (ref 4.22–5.81)
RDW: 13.6 % (ref 11.5–15.5)
WBC: 9.3 10*3/uL (ref 4.0–10.5)

## 2024-02-27 LAB — BASIC METABOLIC PANEL WITH GFR
BUN: 14 mg/dL (ref 6–23)
CO2: 27 meq/L (ref 19–32)
Calcium: 9.3 mg/dL (ref 8.4–10.5)
Chloride: 100 meq/L (ref 96–112)
Creatinine, Ser: 1.03 mg/dL (ref 0.40–1.50)
GFR: 78.86 mL/min (ref >60.00)
Glucose, Bld: 71 mg/dL (ref 70–99)
Potassium: 4.5 meq/L (ref 3.5–5.1)
Sodium: 138 meq/L (ref 135–145)

## 2024-02-27 LAB — LIPID PANEL
Cholesterol: 198 mg/dL (ref 0–200)
HDL: 41.1 mg/dL (ref >39.00)
LDL Cholesterol: 142 mg/dL — ABNORMAL HIGH (ref 0–99)
NonHDL: 157.1
Total CHOL/HDL Ratio: 5
Triglycerides: 76 mg/dL (ref 0.0–149.0)
VLDL: 15.2 mg/dL (ref 0.0–40.0)

## 2024-02-27 LAB — TSH: TSH: 3.4 u[IU]/mL (ref 0.35–5.50)

## 2024-02-27 LAB — PSA: PSA: 0.67 ng/mL (ref 0.10–4.00)

## 2024-02-27 MED ORDER — METHYLPHENIDATE HCL ER (CD) 30 MG PO CPCR: 30.0000 mg | ORAL_CAPSULE | Freq: Two times a day (BID) | ORAL | 0 refills | Status: DC

## 2024-02-27 MED ORDER — TAMSULOSIN HCL 0.4 MG PO CAPS: 0.4000 mg | ORAL_CAPSULE | Freq: Every day | ORAL | 3 refills | Status: AC

## 2024-02-27 MED ORDER — ROSUVASTATIN CALCIUM 20 MG PO TABS: 20.0000 mg | ORAL_TABLET | Freq: Every day | ORAL | 3 refills | Status: DC

## 2024-02-27 MED ORDER — METOPROLOL SUCCINATE ER 25 MG PO TB24: 25.0000 mg | ORAL_TABLET | Freq: Every day | ORAL | 3 refills | Status: AC

## 2024-02-27 MED ORDER — TRAZODONE HCL 50 MG PO TABS: 50.0000 mg | ORAL_TABLET | Freq: Every day | ORAL | 3 refills | Status: DC

## 2024-02-27 MED ORDER — ESZOPICLONE 3 MG PO TABS: 3.0000 mg | ORAL_TABLET | Freq: Every day | ORAL | 1 refills | Status: DC

## 2024-02-27 NOTE — Telephone Encounter (Signed)
 This was for the high Hgb. See my Result Note for my instructions

## 2024-02-27 NOTE — Telephone Encounter (Signed)
 Critical routed to Dr Alyne Babinski for advise

## 2024-02-27 NOTE — Progress Notes (Signed)
 Subjective:    Patient ID: [name], male    DOB: 10-15-1960, 63 y.o.   MRN: [id number]  HPI Here for a well exam and to discuss some other issues. He had a fall at home 2 nights ago which was witnessed by his wife. He had eaten pizza the day before and this caused him to have terrible diarrhea. No fever or nausea or cramps. The next day he felt weak and lightheaded, and while standing in his kitchene he passed out for a few seconds. He fell to the floor and bumped his head against the counter. He regained consciousness quickly and he has felt fine since then. He assumes he was simply dehydrated. Otherwise he asks if we can take over prescribing his sleep and ADHD medications. He has been seeing Linde Reveal NP for these issues, and he has been doing well. The last issue is his urine stream has become slow and he has to get up 3 times a night to urinate. There is no discomfort.    Review of Systems  Constitutional: Negative.   HENT: Negative.    Eyes: Negative.   Respiratory: Negative.    Cardiovascular: Negative.   Gastrointestinal: Negative.   Genitourinary:  Positive for frequency. Negative for dysuria, flank pain, hematuria and urgency.  Musculoskeletal: Negative.   Skin: Negative.   Neurological:  Positive for syncope and light-headedness.  Psychiatric/Behavioral:  Positive for decreased concentration and sleep disturbance. Negative for agitation, behavioral problems, confusion, dysphoric mood and hallucinations. The patient is not nervous/anxious.        Objective:   Physical Exam Constitutional:      General: He is not in acute distress.    Appearance: Normal appearance. He is well-developed. He is not diaphoretic.  HENT:     Head: Normocephalic and atraumatic.     Right Ear: External ear normal.     Left Ear: External ear normal.     Nose: Nose normal.     Mouth/Throat:     Pharynx: No oropharyngeal exudate.  Eyes:     General: No scleral icterus.       Right eye:  No discharge.        Left eye: No discharge.     Conjunctiva/sclera: Conjunctivae normal.     Pupils: Pupils are equal, round, and reactive to light.  Neck:     Thyroid : No thyromegaly.     Vascular: No JVD.     Trachea: No tracheal deviation.  Cardiovascular:     Rate and Rhythm: Normal rate and regular rhythm.     Pulses: Normal pulses.     Heart sounds: Normal heart sounds. No murmur heard.    No friction rub. No gallop.  Pulmonary:     Effort: Pulmonary effort is normal. No respiratory distress.     Breath sounds: Normal breath sounds. No wheezing or rales.  Chest:     Chest wall: No tenderness.  Abdominal:     General: Bowel sounds are normal. There is no distension.     Palpations: Abdomen is soft. There is no mass.     Tenderness: There is no abdominal tenderness. There is no guarding or rebound.  Genitourinary:    Penis: Normal. No tenderness.      Testes: Normal.     Rectum: Normal. Guaiac result negative.     Comments: Prostate is smooth and moderately enlarged, non tender  Musculoskeletal:        General: No tenderness. Normal range of  motion.     Cervical back: Neck supple.  Lymphadenopathy:     Cervical: No cervical adenopathy.  Skin:    General: Skin is warm and dry.     Coloration: Skin is not pale.     Findings: No erythema or rash.  Neurological:     General: No focal deficit present.     Mental Status: He is alert and oriented to person, place, and time.     Cranial Nerves: No cranial nerve deficit.     Motor: No abnormal muscle tone.     Coordination: Coordination normal.     Deep Tendon Reflexes: Reflexes are normal and symmetric. Reflexes normal.  Psychiatric:        Mood and Affect: Mood normal.        Behavior: Behavior normal.        Thought Content: Thought content normal.        Judgment: Judgment normal.           Assessment & Plan:  Well exam. We discussed diet and exercise. Get fasting labs. We agreed to take over all his  prescriptions, so we refilled Methyphenidate CD 30 mg BID, Lunesta 3 mg at bedtime, and Trazodone  50 mg at bedtime. He will try Tamsulosin 0.4 mg at bedtime for the BPH. I agree his syncopal episode was likely due to dehydration, which is now resolved.  Corita Diego, MD

## 2024-02-28 ENCOUNTER — Other Ambulatory Visit: Payer: Self-pay

## 2024-02-28 DIAGNOSIS — E7889 Other lipoprotein metabolism disorders: Secondary | ICD-10-CM

## 2024-02-28 DIAGNOSIS — R17 Unspecified jaundice: Secondary | ICD-10-CM

## 2024-02-28 DIAGNOSIS — D582 Other hemoglobinopathies: Secondary | ICD-10-CM

## 2024-02-28 MED ORDER — ROSUVASTATIN CALCIUM 40 MG PO TABS: 40.0000 mg | ORAL_TABLET | Freq: Every day | ORAL | 3 refills | Status: AC

## 2024-02-28 NOTE — Telephone Encounter (Signed)
 Reviewed lab results and Dr Alyne Babinski advise. Pt verbalized understanding

## 2024-04-07 ENCOUNTER — Telehealth: Payer: Self-pay

## 2024-04-07 DIAGNOSIS — R55 Syncope and collapse: Secondary | ICD-10-CM

## 2024-04-07 NOTE — Addendum Note (Signed)
 Addended by: JOHNNY SENIOR A on: 04/07/2024 04:59 PM   Modules accepted: Orders

## 2024-04-07 NOTE — Telephone Encounter (Signed)
 Copied from CRM [id number]. Topic: Referral - Question >> Apr 07, 2024  8:03 AM [name] wrote: Reason for CRM: Patient called.. said he is following up on getting a referral for a cardiologist?  Please advise.

## 2024-04-07 NOTE — Telephone Encounter (Signed)
 I did the referral

## 2024-04-08 DIAGNOSIS — F411 Generalized anxiety disorder: Secondary | ICD-10-CM | POA: Diagnosis not present

## 2024-04-08 DIAGNOSIS — F413 Other mixed anxiety disorders: Secondary | ICD-10-CM | POA: Diagnosis not present

## 2024-04-08 DIAGNOSIS — F900 Attention-deficit hyperactivity disorder, predominantly inattentive type: Secondary | ICD-10-CM | POA: Diagnosis not present

## 2024-04-08 DIAGNOSIS — Z5181 Encounter for therapeutic drug level monitoring: Secondary | ICD-10-CM | POA: Diagnosis not present

## 2024-04-08 NOTE — Telephone Encounter (Signed)
 Called and spoke with patient offered to get him sch to see Dr. Johnny or another provider in the office, patient would like to just see Cardio, sent patient telephone number to call and get scheduled

## 2024-05-29 ENCOUNTER — Other Ambulatory Visit

## 2024-06-04 DIAGNOSIS — Z6826 Body mass index (BMI) 26.0-26.9, adult: Secondary | ICD-10-CM | POA: Diagnosis not present

## 2024-06-04 DIAGNOSIS — M4316 Spondylolisthesis, lumbar region: Secondary | ICD-10-CM | POA: Diagnosis not present

## 2024-06-14 DIAGNOSIS — S61210A Laceration without foreign body of right index finger without damage to nail, initial encounter: Secondary | ICD-10-CM | POA: Diagnosis not present

## 2024-07-09 NOTE — Progress Notes (Addendum)
 [name]                                          MRN: [id number]   07/09/2024   The [facility] Quality Team Specialist reviewed this patient medical record for the purposes of chart review for care gap closure. The following were reviewed: chart review for care gap closure-colorectal cancer screening.  09/12/2024- no col screening found to close gap.    [facility] Quality Team

## 2024-07-12 NOTE — Progress Notes (Unsigned)
 Cardiology Office Note:  .   Date:  07/14/2024  ID:  [name], DOB 26-Aug-1961, MRN [id number] PCP: [name] LABOR, MD  [facility] Providers Cardiologist:  None {   History of Present Illness: .    Chief Complaint  Patient presents with   Syncope   New Patient (Initial Visit)    [name] is a 63 y.o. male with history of nonobstructive CAD, OSA, HLD who presents for the evaluation of syncope at the request of [name] LABOR, MD.   History of Present Illness   [name] is a 63 year old male who presents with a passing out episode.  Approximately one month ago, he experienced a passing out episode. He felt dizzy and lightheaded after returning home from work around 5:30 PM. While sitting, he suddenly lost consciousness, hitting the floor and experiencing what he described as a 'mini seizure' due to hitting his head repeatedly. He regained consciousness within seconds and felt disoriented until the next day. Since then, he has experienced periodic dizziness and lightheadedness, typically occurring at home rather than at work.  He has a history of nonobstructive coronary artery disease and was evaluated in 2021 for chest discomfort. A coronary CTA at that time showed no significant disease, but he had an elevated coronary calcium  score and was started on a statin. He experiences occasional chest 'twinges' but no significant chest pain or dyspnea.  He has sleep apnea but is not currently using a CPAP machine due to insurance issues and a lack of follow-up for a home study. He previously used a CPAP but discontinued it. He has high blood counts, which were previously evaluated by a hematologist, and he regularly donates blood due to high hemoglobin levels. Diagnosis secondary polycythemia.   His current medications include metoprolol  succinate 25 mg daily, Flomax , and a statin. He denies any new medications and does not associate his symptoms with his current  medication regimen. He works outside at a nursery and admits to possibly not staying well-hydrated, drinking less than a gallon of water daily.          Problem List 1. OSA 2. Secondary polycythemia  3. HLD -T chol 198, HDL 41, LDL 142, TG 76 4. CAD -50-69% LAD; negative CT FFR 2021 -CAC 141 (80th percentile)    ROS: All other ROS reviewed and negative. Pertinent positives noted in the HPI.     Studies Reviewed: SABRA   EKG Interpretation Date/Time:  Monday July 14 2024 13:04:13 EDT Ventricular Rate:  89 PR Interval:  136 QRS Duration:  94 QT Interval:  352 QTC Calculation: 428 R Axis:   69  Text Interpretation: Normal sinus rhythm Normal ECG Confirmed by [name] [phone]) on 07/14/2024 1:21:21 PM   TTE 08/11/2020  1. Prominant somewhat apically displaced papillary muscles. Low normal  GLS -14.3. Left ventricular ejection fraction, by estimation, is 60 to  65%. The left ventricle has normal function. The left ventricle has no  regional wall motion abnormalities. The  left ventricular internal cavity size was mildly dilated. Left ventricular  diastolic parameters are consistent with Grade I diastolic dysfunction  (impaired relaxation).   2. Right ventricular systolic function is normal. The right ventricular  size is normal. There is normal pulmonary artery systolic pressure.   3. The mitral valve is normal in structure. Trivial mitral valve  regurgitation. No evidence of mitral stenosis.   4. The aortic valve is normal in structure. Aortic valve regurgitation  is  not visualized. No aortic stenosis is present.   5. The inferior vena cava is normal in size with greater than 50%  respiratory variability, suggesting right atrial pressure of 3 mmHg.   CCTA 08/04/2020 IMPRESSION: 1. Coronary calcium  score of 141. This was 80th percentile for age and sex matched controls.   2. Normal coronary origin with right dominance.   3. Moderate stenosis (50-69%) in the mid  LAD.   4. Moderate stenosis in the ramus (50-69%).   5. Mild stenosis in th RCA and PDA (25-49%).   6. Minimal stenosis in the LCX (<25%). Physical Exam:   VS:  BP 129/79 (BP Location: Left Arm, Patient Position: Sitting)   Pulse 79   Ht 5' 6 (1.676 m)   Wt 165 lb (74.8 kg)   SpO2 96%   BMI 26.63 kg/m    Wt Readings from Last 3 Encounters:  07/14/24 165 lb (74.8 kg)  02/27/24 165 lb 3.2 oz (74.9 kg)  10/30/22 180 lb 6.4 oz (81.8 kg)    GEN: Well nourished, well developed in no acute distress NECK: No JVD; No carotid bruits CARDIAC: RRR, no murmurs, rubs, gallops RESPIRATORY:  Clear to auscultation without rales, wheezing or rhonchi  ABDOMEN: Soft, non-tender, non-distended EXTREMITIES:  No edema; No deformity  ASSESSMENT AND PLAN: .   Assessment and Plan    Syncope and recurrent lightheadedness Likely vasovagal or related to polycythemia. EKG normal. Metoprolol  and Flomax  may contribute. No sinister symptoms. - Recheck echocardiogram. - Order two-week Zio patch to exclude arrhythmia. - Advise to remain hydrated. - See hematology about polycythemia.   Polycythemia with elevated hemoglobin Elevated hemoglobin at 19.3. Symptoms may relate to polycythemia. Suspected dehydration as a factor. - Refer to [facility], hematology, for evaluation. - Advise to drink more water.  Nonobstructive coronary artery disease Previous coronary CTA showed no significant disease. Symptoms unlikely angina. - ASA and statin.   Mixed hyperlipidemia not at goal Lipids not at goal despite statin therapy. Previous cholesterol levels risen, re-evaluation showed no significant change. - Consider referral to pharmacy clinic for lipid management. - needs PCSK9. Continue crestor  40 mg daily.   Untreated obstructive sleep apnea Previously used CPAP but discontinued due to insurance issues. Current symptoms may be exacerbated by untreated sleep apnea. - Advise primary care physician to assist with  obtaining CPAP machine.              Follow-up: Return in about 6 months (around 01/12/2025).   Signed, Darryle DASEN. Barbaraann, MD, [facility]  [facility]  [address] [place], [place] [address] [phone]  2:18 PM

## 2024-07-14 ENCOUNTER — Ambulatory Visit: Attending: Cardiovascular Disease

## 2024-07-14 ENCOUNTER — Ambulatory Visit: Attending: Cardiovascular Disease | Admitting: Cardiovascular Disease

## 2024-07-14 ENCOUNTER — Encounter: Payer: Self-pay | Admitting: Cardiovascular Disease

## 2024-07-14 VITALS — BP 129/79 | HR 79 | Ht 66.0 in | Wt 165.0 lb

## 2024-07-14 DIAGNOSIS — E782 Mixed hyperlipidemia: Secondary | ICD-10-CM | POA: Diagnosis not present

## 2024-07-14 DIAGNOSIS — G4733 Obstructive sleep apnea (adult) (pediatric): Secondary | ICD-10-CM | POA: Diagnosis not present

## 2024-07-14 DIAGNOSIS — R55 Syncope and collapse: Secondary | ICD-10-CM

## 2024-07-14 DIAGNOSIS — R42 Dizziness and giddiness: Secondary | ICD-10-CM

## 2024-07-14 DIAGNOSIS — I2510 Atherosclerotic heart disease of native coronary artery without angina pectoris: Secondary | ICD-10-CM

## 2024-07-14 DIAGNOSIS — D751 Secondary polycythemia: Secondary | ICD-10-CM

## 2024-07-14 NOTE — Patient Instructions (Addendum)
 Medication Instructions:  No medication changes were made at this visit. Continue current regimen.   *If you need a refill on your cardiac medications before your next appointment, please call your pharmacy*  Lab Work: None ordered today. If you have labs (blood work) drawn today and your tests are completely normal, you will receive your results only by: MyChart Message (if you have MyChart) OR A paper copy in the mail If you have any lab test that is abnormal or we need to change your treatment, we will call you to review the results.  Testing/Procedures: Your physician has requested that you wear a Zio heart monitor for 14 days. This will be mailed to your home with instructions on how to apply the monitor and how to return it when finished. Please allow 2 weeks after returning the heart monitor before our office calls you with the results.   Your physician has requested that you have an echocardiogram. Echocardiography is a painless test that uses sound waves to create images of your heart. It provides your doctor with information about the size and shape of your heart and how well your heart's chambers and valves are working. This procedure takes approximately one hour. There are no restrictions for this procedure. Please do NOT wear cologne, perfume, aftershave, or lotions (deodorant is allowed). Please arrive 15 minutes prior to your appointment time.  Please note: We ask at that you not bring children with you during ultrasound (echo/ vascular) testing. Due to room size and safety concerns, children are not allowed in the ultrasound rooms during exams. Our front office staff cannot provide observation of children in our lobby area while testing is being conducted. An adult accompanying a patient to their appointment will only be allowed in the ultrasound room at the discretion of the ultrasound technician under special circumstances. We apologize for any inconvenience.   Follow-Up: At  [facility], you and your health needs are our priority.  As part of our continuing mission to provide you with exceptional heart care, our providers are all part of one team.  This team includes your primary Cardiologist (physician) and Advanced Practice Providers or APPs (Physician Assistants and Nurse Practitioners) who all work together to provide you with the care you need, when you need it.  Your next appointment:   6 month(s)  Provider:   Dr. Barbaraann    We recommend signing up for the patient portal called MyChart.  Sign up information is provided on this After Visit Summary.  MyChart is used to connect with patients for Virtual Visits (Telemedicine).  Patients are able to view lab/test results, encounter notes, upcoming appointments, etc.  Non-urgent messages can be sent to your provider as well.   To learn more about what you can do with MyChart, go to [email or web address].   Other Instructions You have been referred to PharmD to discuss initiation of PCSK9 inhibitor medication for lipid management. Someone from our office will be contacting you to schedule this appointment.  You have been referred to Norleen Kidney, MD with oncology to discuss polycythemia. Someone from their office will be contacting you to schedule this appointment.   ZIO XT- Long Term Monitor Instructions  Your physician has requested you wear a ZIO patch monitor for 14 days.   This is a single patch monitor. [employer] supplies one patch monitor per enrollment. Additional  stickers are not available. Please do not apply patch if you will be having a Nuclear Stress Test,  Echocardiogram, Cardiac  CT, MRI, or Chest Xray during the period you would be wearing the  monitor. The patch cannot be worn during these tests. You cannot remove and re-apply the  ZIO XT patch monitor.   Your ZIO patch monitor will be mailed 3 day USPS to your address on file. It may take 3-5 days  to receive your monitor after you  have been enrolled.   Once you have received your monitor, please review the enclosed instructions. Your monitor  has already been registered assigning a specific monitor serial # to you.     Billing and Patient Assistance Program Information  We have supplied [employer] with any of your insurance information on file for billing purposes.  [employer] offers a sliding scale Patient Assistance Program for patients that do not have  insurance, or whose insurance does not completely cover the cost of the ZIO monitor.  You must apply for the Patient Assistance Program to qualify for this discounted rate.   To apply, please call [employer] at [phone], select option 4, select option 2, ask to apply for  Patient Assistance Program. Meredeth will ask your household income, and how many people  are in your household. They will quote your out-of-pocket cost based on that information.  [employer] will also be able to set up a 58-month, interest-free payment plan if needed.     Applying the monitor  Shave hair from upper left chest.  Hold abrader disc by orange tab. Rub abrader in 40 strokes over the upper left chest as  indicated in your monitor instructions.  Clean area with 4 enclosed alcohol pads. Let dry.  Apply patch as indicated in monitor instructions. Patch will be placed under collarbone on left  side of chest with arrow pointing upward.  Rub patch adhesive wings for 2 minutes. Remove white label marked 1. Remove the white  label marked 2. Rub patch adhesive wings for 2 additional minutes.  While looking in a mirror, press and release button in center of patch. A small green light will  flash 3-4 times. This will be your only indicator that the monitor has been turned on.  Do not shower for the first 24 hours. You may shower after the first 24 hours.  Press the button if you feel a symptom. You will hear a small click. Record Date, Time and  Symptom in the Patient Logbook.  When you are ready  to remove the patch, follow instructions on the last 2 pages of Patient  Logbook. Stick patch monitor onto the last page of Patient Logbook.   Place Patient Logbook in the blue and white box. Use locking tab on box and tape box closed  securely. The blue and white box has prepaid postage on it. Please place it in the mailbox as  soon as possible. Your physician should have your test results approximately 7 days after the  monitor has been mailed back to [facility].   Call [facility] Customer Care at [phone] if you have questions regarding  your ZIO XT patch monitor. Call them immediately if you see an orange light blinking on your  monitor.   If your monitor falls off in less than 4 days, contact our Monitor department at [phone].   If your monitor becomes loose or falls off after 4 days call [employer] at [phone] for  suggestions on securing your monitor.

## 2024-07-14 NOTE — Progress Notes (Unsigned)
 Enrolled for [employer] to mail a ZIO XT long term holter monitor to the patients address on file.

## 2024-07-29 NOTE — Progress Notes (Deleted)
 [facility] [phone]   [phone]  INITIAL CONSULT NOTE  Patient Care Team: Johnny Garnette LABOR, MD as PCP - General   CHIEF COMPLAINTS/PURPOSE OF CONSULTATION:  Polycythemia  HISTORY OF PRESENTING ILLNESS:  [name] 63 y.o. male with medical history significant for ***  On review of the previous records, Mr. Bensinger was evaluated by Dr. Onesimo most recently in March 2021 with negative MPN panel.   On exam today ***  MEDICAL HISTORY:  Past Medical History:  Diagnosis Date   Allergy    History of nephrolithiasis    Hyperlipidemia    Hypertension    Sleep apnea     SURGICAL HISTORY: Past Surgical History:  Procedure Laterality Date   APPENDECTOMY     IRRIGATION AND DEBRIDEMENT ABSCESS Left 12/13/2015   Procedure: IRRIGATION AND DEBRIDEMENT LEFT GLUTEAL HEMATOMA;  Surgeon: Bernarda Ned, MD;  Location: [facility];  Service: General;  Laterality: Left;   LUMBAR LAMINECTOMY     UVULECTOMY      SOCIAL HISTORY: Social History   Socioeconomic History   Marital status: Married    Spouse name: Not on file   Number of children: 3   Years of education: Not on file   Highest education level: Associate degree: academic program  Occupational History   Not on file  Tobacco Use   Smoking status: Never   Smokeless tobacco: Never  Substance and Sexual Activity   Alcohol use: Yes    Alcohol/week: 0.0 standard drinks of alcohol    Comment: occ   Drug use: No   Sexual activity: Not on file  Other Topics Concern   Not on file  Social History Narrative   Not on file   Social Drivers of Health   Financial Resource Strain: Low Risk  (11/11/2021)   Overall Financial Resource Strain (CARDIA)    Difficulty of Paying Living Expenses: Not hard at all  Food Insecurity: No Food Insecurity (11/11/2021)   Hunger Vital Sign    Worried About Running Out of Food in the Last Year: Never true    Ran Out of Food in the Last Year: Never true   Transportation Needs: No Transportation Needs (11/11/2021)   PRAPARE - Administrator, Civil Service (Medical): No    Lack of Transportation (Non-Medical): No  Physical Activity: Sufficiently Active (11/11/2021)   Exercise Vital Sign    Days of Exercise per Week: 5 days    Minutes of Exercise per Session: 50 min  Stress: No Stress Concern Present (11/11/2021)   [employer] of Occupational Health - Occupational Stress Questionnaire    Feeling of Stress : Not at all  Social Connections: Unknown (02/06/2023)   Received from [facility]   Social Network    Social Network: Not on file  Intimate Partner Violence: Unknown (02/06/2023)   Received from [facility]   HITS    Physically Hurt: Not on file    Insult or Talk Down To: Not on file    Threaten Physical Harm: Not on file    Scream or Curse: Not on file    FAMILY HISTORY: Family History  Problem Relation Age of Onset   Emphysema Mother    Asthma Mother    Heart disease Father 3   Hyperlipidemia Other        family  hx   Hypertension Other        family hx   Stroke Other  family hx   Sudden death Other        family hx    ALLERGIES:  is allergic to hydrocodone  and zetia [ezetimibe].  MEDICATIONS:  Current Outpatient Medications  Medication Sig Dispense Refill   aspirin  EC 81 MG tablet Take 1 tablet (81 mg total) by mouth daily. 30 tablet    Eszopiclone  3 MG TABS Take 1 tablet (3 mg total) by mouth at bedtime. 90 tablet 1   methylphenidate  (METADATE  CD) 30 MG CR capsule Take 1 capsule (30 mg total) by mouth 2 (two) times daily. 60 capsule 0   metoprolol  succinate (TOPROL  XL) 25 MG 24 hr tablet Take 1 tablet (25 mg total) by mouth daily. 90 tablet 3   rosuvastatin  (CRESTOR ) 40 MG tablet Take 1 tablet (40 mg total) by mouth daily. 90 tablet 3   tamsulosin  (FLOMAX ) 0.4 MG CAPS capsule Take 1 capsule (0.4 mg total) by mouth daily. 90 capsule 3   traZODone  (DESYREL ) 50 MG tablet Take 1 tablet (50 mg  total) by mouth at bedtime. 90 tablet 3   No current facility-administered medications for this visit.    REVIEW OF SYSTEMS:   Constitutional: ( - ) fevers, ( - )  chills , ( - ) night sweats Eyes: ( - ) blurriness of vision, ( - ) double vision, ( - ) watery eyes Ears, nose, mouth, throat, and face: ( - ) mucositis, ( - ) sore throat Respiratory: ( - ) cough, ( - ) dyspnea, ( - ) wheezes Cardiovascular: ( - ) palpitation, ( - ) chest discomfort, ( - ) lower extremity swelling Gastrointestinal:  ( - ) nausea, ( - ) heartburn, ( - ) change in bowel habits Skin: ( - ) abnormal skin rashes Lymphatics: ( - ) new lymphadenopathy, ( - ) easy bruising Neurological: ( - ) numbness, ( - ) tingling, ( - ) new weaknesses Behavioral/Psych: ( - ) mood change, ( - ) new changes  All other systems were reviewed with the patient and are negative.  PHYSICAL EXAMINATION: ECOG PERFORMANCE STATUS: {CHL ONC ECOG PS:[id number]}  There were no vitals filed for this visit. There were no vitals filed for this visit.  GENERAL: well appearing *** in NAD  SKIN: skin color, texture, turgor are normal, no rashes or significant lesions EYES: conjunctiva are pink and non-injected, sclera clear OROPHARYNX: no exudate, no erythema; lips, buccal mucosa, and tongue normal  NECK: supple, non-tender LYMPH:  no palpable lymphadenopathy in the cervical, axillary or supraclavicular lymph nodes.  LUNGS: clear to auscultation and percussion with normal breathing effort HEART: regular rate & rhythm and no murmurs and no lower extremity edema ABDOMEN: soft, non-tender, non-distended, normal bowel sounds Musculoskeletal: no cyanosis of digits and no clubbing  PSYCH: alert & oriented x 3, fluent speech NEURO: no focal motor/sensory deficits  LABORATORY DATA:  I have reviewed the data as listed    Latest Ref Rng & Units 02/27/2024    2:27 PM 09/12/2022    2:37 PM 09/13/2021    8:26 AM  CBC  WBC 4.0 - 10.5 K/uL 9.3  6.8   6.9   Hemoglobin 13.0 - 17.0 g/dL 80.6 Repeated and verified X2.  19.0 Repeated and verified X2.  18.2 Repeated and verified X2.   Hematocrit 39.0 - 52.0 % 56.7 Repeated and verified X2.  55.7  54.9   Platelets 150.0 - 400.0 K/uL 189.0  151.0  165.0        Latest Ref Rng &  Units 02/27/2024    2:27 PM 09/12/2022    2:37 PM 09/13/2021    8:26 AM  CMP  Glucose 70 - 99 mg/dL 71  71  76   BUN 6 - 23 mg/dL 14  13  10    Creatinine 0.40 - 1.50 mg/dL 8.96  8.94  9.08   Sodium 135 - 145 mEq/L 138  139  137   Potassium 3.5 - 5.1 mEq/L 4.5  4.1  4.3   Chloride 96 - 112 mEq/L 100  101  101   CO2 19 - 32 mEq/L 27  27  29    Calcium  8.4 - 10.5 mg/dL 9.3  9.3  9.9   Total Protein 6.0 - 8.3 g/dL 6.2  6.3  6.4   Total Bilirubin 0.2 - 1.2 mg/dL 1.3  1.8  1.7   Alkaline Phos 39 - 117 U/L 60  58  42   AST 0 - 37 U/L 25  17  34   ALT 0 - 53 U/L 32  26  27      PATHOLOGY: ***  BLOOD FILM: *** Review of the peripheral blood smear showed normal appearing white cells with neutrophils that were appropriately lobated and granulated. There was no predominance of bi-lobed or hyper-segmented neutrophils appreciated. No Dohle bodies were noted. There was no left shifting, immature forms or blasts noted. Lymphocytes remain normal in size without any predominance of large granular lymphocytes. Red cells show no anisopoikilocytosis, macrocytes , microcytes or polychromasia. There were no schistocytes, target cells, echinocytes, acanthocytes, dacrocytes, or stomatocytes.There was no rouleaux formation, nucleated red cells, or intra-cellular inclusions noted. The platelets are normal in size, shape, and color without any clumping evident.  RADIOGRAPHIC STUDIES: I have personally reviewed the radiological images as listed and agreed with the findings in the report. No results found.  ASSESSMENT & PLAN ***  No orders of the defined types were placed in this encounter.   All questions were answered. The patient  knows to call the clinic with any problems, questions or concerns.  I have spent a total of {CHL ONC TIME VISIT - [id number]} minutes of face-to-face and non-face-to-face time, preparing to see the patient, obtaining and/or reviewing separately obtained history, performing a medically appropriate examination, counseling and educating the patient, ordering medications/tests/procedures, referring and communicating with other health care professionals, documenting clinical information in the electronic health record, independently interpreting results and communicating results to the patient, and care coordination.   Johnston Police, PA-C Department of Hematology/Oncology [facility] Phone: [phone]

## 2024-07-30 ENCOUNTER — Inpatient Hospital Stay: Admitting: Physician Assistant

## 2024-07-30 ENCOUNTER — Inpatient Hospital Stay

## 2024-08-05 DIAGNOSIS — R55 Syncope and collapse: Secondary | ICD-10-CM | POA: Diagnosis not present

## 2024-08-06 DIAGNOSIS — M25551 Pain in right hip: Secondary | ICD-10-CM | POA: Diagnosis not present

## 2024-08-10 DIAGNOSIS — R55 Syncope and collapse: Secondary | ICD-10-CM | POA: Diagnosis not present

## 2024-08-11 ENCOUNTER — Ambulatory Visit: Payer: Self-pay | Admitting: Cardiovascular Disease

## 2024-08-12 NOTE — Telephone Encounter (Signed)
-----   Message from Darryle ONEIDA Decent sent at 08/11/2024  3:32 PM EST ----- No significant arrhythmias. My chart. -W ----- Message ----- From: Decent Darryle Ned, MD Sent: 08/10/2024   9:40 PM EST To: Darryle Ned Decent, MD

## 2024-08-12 NOTE — Telephone Encounter (Signed)
 Called  left detail monitor results on secure voicemail . Result are released to mychart.  Any question may call back to the office.

## 2024-08-15 ENCOUNTER — Other Ambulatory Visit: Payer: Self-pay | Admitting: Family Medicine

## 2024-08-15 NOTE — Telephone Encounter (Signed)
 Copied from CRM [id number]. Topic: Clinical - Medication Refill >> Aug 15, 2024  8:27 AM Brittany M wrote: Medication: methylphenidate  (METADATE  CD) 30 MG CR capsule  Has the patient contacted their pharmacy? Yes (Agent: If no, request that the patient contact the pharmacy for the refill. If patient does not wish to contact the pharmacy document the reason why and proceed with request.) (Agent: If yes, when and what did the pharmacy advise?)  This is the patient's preferred pharmacy:  [id number] [place], [place] - [address] [place] [address] Phone: [phone] Fax: [phone]  Is this the correct pharmacy for this prescription? Yes If no, delete pharmacy and type the correct one.   Has the prescription been filled recently? Yes  Is the patient out of the medication? Yes  Has the patient been seen for an appointment in the last year OR does the patient have an upcoming appointment? Yes  Can we respond through MyChart? Yes  Agent: Please be advised that Rx refills may take up to 3 business days. We ask that you follow-up with your pharmacy.

## 2024-08-15 NOTE — Telephone Encounter (Signed)
 Copied from CRM [id number]. Topic: Clinical - Medication Question >> Aug 15, 2024  8:29 AM Laymon HERO wrote: Reason for CRM: patient asking for  traZODone  (DESYREL ) 50 MG tablet to be increased up to 100MG 

## 2024-08-18 ENCOUNTER — Encounter: Payer: Self-pay | Admitting: Family Medicine

## 2024-08-18 ENCOUNTER — Ambulatory Visit: Admitting: Family Medicine

## 2024-08-18 VITALS — BP 124/78 | HR 93 | Temp 98.4°F | Ht 66.5 in | Wt 160.0 lb

## 2024-08-18 DIAGNOSIS — F900 Attention-deficit hyperactivity disorder, predominantly inattentive type: Secondary | ICD-10-CM | POA: Diagnosis not present

## 2024-08-18 DIAGNOSIS — G4730 Sleep apnea, unspecified: Secondary | ICD-10-CM

## 2024-08-18 DIAGNOSIS — M25551 Pain in right hip: Secondary | ICD-10-CM | POA: Diagnosis not present

## 2024-08-18 MED ORDER — METHYLPHENIDATE HCL ER (CD) 30 MG PO CPCR: 30.0000 mg | ORAL_CAPSULE | Freq: Two times a day (BID) | ORAL | 0 refills | Status: AC

## 2024-08-18 NOTE — Progress Notes (Signed)
   Subjective:    Patient ID: [name], male    DOB: 1960-12-31, 63 y.o.   MRN: [id number]  HPI Here to discuss sleep apnea. We had referred him to Pulmonology last year, but he says he did not hear back from them. He would another referral. He needs refills on Methyphenadate. Also he asks about pain in the right hip that started about 3 weeks ago. No recent trauma. He points to the anterior right hip area and to the lateral area. He saw Dr. Laurell Simpers of [facility] last week for this, and he was diagnosed with bursitis. He was given a shot of cortisone, and this helped for a few days. However the pain has returned. He has the most pain when he lifts his right leg. No pain with standing.    Review of Systems  Constitutional:  Positive for fatigue.  Respiratory: Negative.    Cardiovascular: Negative.   Musculoskeletal:  Positive for arthralgias.       Objective:   Physical Exam Constitutional:      Appearance: Normal appearance.  Cardiovascular:     Rate and Rhythm: Normal rate and regular rhythm.     Pulses: Normal pulses.     Heart sounds: Normal heart sounds.  Pulmonary:     Effort: Pulmonary effort is normal.     Breath sounds: Normal breath sounds.  Musculoskeletal:     Comments: He is tender over the right hip flexor insertion onto the pelvis. He has pain with flexion of the right hip   Neurological:     Mental Status: He is alert.           Assessment & Plan:  For the sleep apnea, we will place another referral to Pulmonology. His ADHD is stable, so we refilled the Methylphenidate . The right hip pain seems to be due to a hip flexor strain. I advised him to follow up with Dr. Simpers.  Garnette Olmsted, MD

## 2024-08-21 ENCOUNTER — Other Ambulatory Visit (HOSPITAL_COMMUNITY): Payer: Self-pay

## 2024-08-21 MED ORDER — TRAZODONE HCL 100 MG PO TABS
100.0000 mg | ORAL_TABLET | Freq: Every day | ORAL | 3 refills | Status: AC
Start: 2024-08-21 — End: ?
  Filled 2024-08-21: qty 30, 30d supply, fill #0
  Filled 2024-09-23 – 2024-10-14 (×2): qty 30, 30d supply, fill #1

## 2024-08-21 NOTE — Progress Notes (Unsigned)
 [facility] [phone]   [phone]  INITIAL CONSULT NOTE  Patient Care Team: Johnny Garnette LABOR, MD as PCP - General   CHIEF COMPLAINTS/PURPOSE OF CONSULTATION:  Polycythemia  HISTORY OF PRESENTING ILLNESS:  [name] 63 y.o. male with medical history significant for hyperlipidemia, hypertension and obstructive sleep apnea who presents to the hematology clinic to reestablish care for polycythemia.  He is unaccompanied for this visit.  On review of the previous records, [name] was evaluated by Dr. Onesimo most recently in March 2021 with negative MPN panel and normal erythropoietin  levels.  On exam today, [name] reports he has ongoing fatigue but that is secondary to his stressful job and age-related.  He is able to complete all his daily activities on his own.  He has a good appetite without any weight changes.  He denies nausea, vomiting or bowel habit changes.  He denies easy bruising or signs of active bleeding.  He denies fevers, chills, night sweats, shortness of breath, chest pain or cough.  Rest of the 10 point ROS as below.  MEDICAL HISTORY:  Past Medical History:  Diagnosis Date   Allergy    History of nephrolithiasis    Hyperlipidemia    Hypertension    Sleep apnea     SURGICAL HISTORY: Past Surgical History:  Procedure Laterality Date   APPENDECTOMY     IRRIGATION AND DEBRIDEMENT ABSCESS Left 12/13/2015   Procedure: IRRIGATION AND DEBRIDEMENT LEFT GLUTEAL HEMATOMA;  Surgeon: Bernarda Ned, MD;  Location: [facility];  Service: General;  Laterality: Left;   LUMBAR LAMINECTOMY     UVULECTOMY      SOCIAL HISTORY: Social History   Socioeconomic History   Marital status: Married    Spouse name: Not on file   Number of children: 3   Years of education: Not on file   Highest education level: Associate degree: academic program  Occupational History   Not on file  Tobacco Use   Smoking status: Never   Smokeless tobacco: Never   Substance and Sexual Activity   Alcohol use: Yes    Alcohol/week: 0.0 standard drinks of alcohol    Comment: occ   Drug use: No   Sexual activity: Not on file  Other Topics Concern   Not on file  Social History Narrative   Not on file   Social Drivers of Health   Financial Resource Strain: Low Risk  (11/11/2021)   Overall Financial Resource Strain (CARDIA)    Difficulty of Paying Living Expenses: Not hard at all  Food Insecurity: No Food Insecurity (08/22/2024)   Hunger Vital Sign    Worried About Running Out of Food in the Last Year: Never true    Ran Out of Food in the Last Year: Never true  Transportation Needs: No Transportation Needs (08/22/2024)   PRAPARE - Administrator, Civil Service (Medical): No    Lack of Transportation (Non-Medical): No  Physical Activity: Sufficiently Active (11/11/2021)   Exercise Vital Sign    Days of Exercise per Week: 5 days    Minutes of Exercise per Session: 50 min  Stress: No Stress Concern Present (11/11/2021)   [employer] of Occupational Health - Occupational Stress Questionnaire    Feeling of Stress : Not at all  Social Connections: Unknown (02/06/2023)   Received from [facility]   Social Network    Social Network: Not on file  Intimate Partner Violence: Not At Risk (08/22/2024)   Humiliation, Afraid,  Rape, and Kick questionnaire    Fear of Current or Ex-Partner: No    Emotionally Abused: No    Physically Abused: No    Sexually Abused: No    FAMILY HISTORY: Family History  Problem Relation Age of Onset   Emphysema Mother    Asthma Mother    Heart disease Father 56   Hyperlipidemia Other        family  hx   Hypertension Other        family hx   Stroke Other        family hx   Sudden death Other        family hx    ALLERGIES:  is allergic to hydrocodone  and zetia [ezetimibe].  MEDICATIONS:  Current Outpatient Medications  Medication Sig Dispense Refill   aspirin  EC 81 MG tablet Take 1 tablet (81  mg total) by mouth daily. 30 tablet    Eszopiclone  3 MG TABS Take 1 tablet (3 mg total) by mouth at bedtime. 90 tablet 1   methylphenidate  (METADATE  CD) 30 MG CR capsule Take 1 capsule (30 mg total) by mouth 2 (two) times daily. 60 capsule 0   methylphenidate  (METADATE  CD) 30 MG CR capsule Take 1 capsule (30 mg total) by mouth 2 (two) times daily. 60 capsule 0   methylphenidate  (METADATE  CD) 30 MG CR capsule Take 1 capsule (30 mg total) by mouth 2 (two) times daily. 60 capsule 0   metoprolol  succinate (TOPROL  XL) 25 MG 24 hr tablet Take 1 tablet (25 mg total) by mouth daily. 90 tablet 3   rosuvastatin  (CRESTOR ) 40 MG tablet Take 1 tablet (40 mg total) by mouth daily. 90 tablet 3   tamsulosin  (FLOMAX ) 0.4 MG CAPS capsule Take 1 capsule (0.4 mg total) by mouth daily. 90 capsule 3   traZODone  (DESYREL ) 100 MG tablet Take 1 tablet (100 mg total) by mouth at bedtime. 90 tablet 3   No current facility-administered medications for this visit.    REVIEW OF SYSTEMS:   Constitutional: ( - ) fevers, ( - )  chills , ( - ) night sweats Eyes: ( - ) blurriness of vision, ( - ) double vision, ( - ) watery eyes Ears, nose, mouth, throat, and face: ( - ) mucositis, ( - ) sore throat Respiratory: ( - ) cough, ( - ) dyspnea, ( - ) wheezes Cardiovascular: ( - ) palpitation, ( - ) chest discomfort, ( - ) lower extremity swelling Gastrointestinal:  ( - ) nausea, ( - ) heartburn, ( - ) change in bowel habits Skin: ( - ) abnormal skin rashes Lymphatics: ( - ) new lymphadenopathy, ( - ) easy bruising Neurological: ( - ) numbness, ( - ) tingling, ( - ) new weaknesses Behavioral/Psych: ( - ) mood change, ( - ) new changes  All other systems were reviewed with the patient and are negative.  PHYSICAL EXAMINATION: ECOG PERFORMANCE STATUS: 0 - Asymptomatic  Vitals:   08/22/24 0910  BP: 127/83  Pulse: 85  Resp: 17  Temp: 98.4 F (36.9 C)  SpO2: 97%   Filed Weights   08/22/24 0910  Weight: 165 lb (74.8 kg)     GENERAL: well appearing male in NAD  SKIN: skin color, texture, turgor are normal, no rashes or significant lesions EYES: conjunctiva are pink and non-injected, sclera clear  LUNGS: clear to auscultation and percussion with normal breathing effort HEART: regular rate & rhythm and no murmurs and no lower extremity edema ABDOMEN: soft,  non-tender, non-distended, normal bowel sounds Musculoskeletal: no cyanosis of digits and no clubbing  PSYCH: alert & oriented x 3, fluent speech NEURO: no focal motor/sensory deficits  LABORATORY DATA:  I have reviewed the data as listed    Latest Ref Rng & Units 02/27/2024    2:27 PM 09/12/2022    2:37 PM 09/13/2021    8:26 AM  CBC  WBC 4.0 - 10.5 K/uL 9.3  6.8  6.9   Hemoglobin 13.0 - 17.0 g/dL 80.6 Repeated and verified X2.  19.0 Repeated and verified X2.  18.2 Repeated and verified X2.   Hematocrit 39.0 - 52.0 % 56.7 Repeated and verified X2.  55.7  54.9   Platelets 150.0 - 400.0 K/uL 189.0  151.0  165.0        Latest Ref Rng & Units 02/27/2024    2:27 PM 09/12/2022    2:37 PM 09/13/2021    8:26 AM  CMP  Glucose 70 - 99 mg/dL 71  71  76   BUN 6 - 23 mg/dL 14  13  10    Creatinine 0.40 - 1.50 mg/dL 8.96  8.94  9.08   Sodium 135 - 145 mEq/L 138  139  137   Potassium 3.5 - 5.1 mEq/L 4.5  4.1  4.3   Chloride 96 - 112 mEq/L 100  101  101   CO2 19 - 32 mEq/L 27  27  29    Calcium  8.4 - 10.5 mg/dL 9.3  9.3  9.9   Total Protein 6.0 - 8.3 g/dL 6.2  6.3  6.4   Total Bilirubin 0.2 - 1.2 mg/dL 1.3  1.8  1.7   Alkaline Phos 39 - 117 U/L 60  58  42   AST 0 - 37 U/L 25  17  34   ALT 0 - 53 U/L 32  26  27     RADIOGRAPHIC STUDIES: I have personally reviewed the radiological images as listed and agreed with the findings in the report. LONG TERM MONITOR (3-14 DAYS) Result Date: 08/10/2024 Patch Wear Time:  13 days and 23 hours (2025-10-18T13:33:47-0400 to 2025-11-01T13:33:39-0400) Patient had a min HR of 41 bpm (sinus bradycardia), max HR of 235 bpm  (6 beats non-sustained ventricular tachycardia), and avg HR of 83 bpm (normal sinus rhythm). Predominant underlying rhythm was Sinus Rhythm. 1 run of non-sustained ventricular tachycardia occurred lasting 6 beats (2.6 second duration) with a max rate of 235 bpm (avg 164 bpm). Isolated SVEs were rare (<1.0%), SVE Couplets were rare (<1.0%), and no SVE Triplets were present. Isolated VEs were rare (<1.0%), and no VE Couplets or VE Triplets were present. Impression: No significant arrhythmias detected. Rare ectopy. Signed, Darryle DASEN. Barbaraann, MD, [facility]  [facility] [address] [place], [place] [address] [phone] 9:40 PM   ASSESSMENT & PLAN [name] is a 63 y.o. male who presents to the clinic for evaluation of polycythemia.   There are two types of polycythemia, Primary polycythemia and secondary polycythemia. Primary polycythemia is overproduction of red blood cells due to a driver mutation. The most common mutation is the JAK2 [id number] (95% of cases), but there are other mutations which can cause this disorder. Primary polycythemia is a myeloproliferative neoplasm which may require cytoreductive therapy to decrease risk of thrombosis. This can consist of medications or phlebotomy to drive down the red blood cell counts. Secondary polycythemia is polycythemia driven by low oxygen levels. This represents an appropriate response of the body attempting to increase red  cell volume. Causes of secondary polycythemia include smoking (most common), obstructive sleep apnea (OSA), or living at altitude. This can also be caused by testosterone supplementation. Certain thalassemias can present with marked erythrocytosis , but normal hemoglobin. Secondary polycythemia does not have the same level of thrombotic risk and therefore does not require cytoreductive therapy or phlebotomy.   #Polycythemia, likely secondary to OSA --workup from 08/12/2019 ruled out driver mutations with no evidence of  JAK2/MPL/CALR mutations, erythropoietin  level was normal.  --patient is a non smoker and does not use any testosterone containing products --patient was diagnosed with sleep apnea around 2010. He tolerated BIPAP machine but was not covered by insurance. He was not able to tolerate CPAP machine. He plans to have repeat sleep study next week.  --will complete evaluation for primary polycythemia with BCR/ABL FISH.  --RTC in 6 months or sooner if indicated by the above labs.    Orders Placed This Encounter  Procedures   CBC with Differential (Cancer Center Only)    Standing Status:   Future    Number of Occurrences:   1    Expiration Date:   08/22/2025   CMP (Cancer Center only)    Standing Status:   Future    Number of Occurrences:   1    Expiration Date:   08/22/2025   Erythropoietin     Standing Status:   Future    Number of Occurrences:   1    Expiration Date:   08/22/2025   BCR-ABL1 FISH    Standing Status:   Future    Number of Occurrences:   1    Expiration Date:   08/22/2025    All questions were answered. The patient knows to call the clinic with any problems, questions or concerns.  I have spent a total of 45 minutes minutes of face-to-face and non-face-to-face time, preparing to see the patient, obtaining and/or reviewing separately obtained history, performing a medically appropriate examination, counseling and educating the patient, ordering tests/procedures,  documenting clinical information in the electronic health record, independently interpreting results and communicating results to the patient, and care coordination.   Johnston Police, PA-C Department of Hematology/Oncology [facility] Phone: [phone]  Patient was seen with Dr. Federico  I have read the above note and personally examined the patient. I agree with the assessment and plan as noted above.  Briefly [name] is a 63 year old male who presents for  evaluation of secondary polycythemia.  The patient was previously worked up in 2021 by Dr. Onesimo.  Patient was previously on BiPAP but due to insurance issues with switch to CPAP.  He was unable to tolerate CPAP machine is not currently undergoing any treatment for his sleep apnea.  He was previously tested for JAK2 mutation which was found to be negative.  At this time findings are most consistent with polycythemia secondary to OSA.  There is no indication for phlebotomy at this time.  The patient notes that he is not currently taking any testosterone supplementations and does not have any other clear etiology for his polycythemia.  He voiced understanding of our findings and recommendations moving forward.  Will plan to see him back in approximately 6 months time to reevaluate once he is had his OSA adequately treated.   Norleen IVAR Federico, MD Department of Hematology/Oncology [facility] at [facility] Phone: [phone] Pager: [phone] Email: [email or web address]

## 2024-08-21 NOTE — Telephone Encounter (Signed)
 Done

## 2024-08-22 ENCOUNTER — Inpatient Hospital Stay: Attending: Physician Assistant | Admitting: Physician Assistant

## 2024-08-22 ENCOUNTER — Encounter: Payer: Self-pay | Admitting: Physician Assistant

## 2024-08-22 ENCOUNTER — Other Ambulatory Visit: Payer: Self-pay

## 2024-08-22 ENCOUNTER — Inpatient Hospital Stay

## 2024-08-22 VITALS — BP 127/83 | HR 85 | Temp 98.4°F | Resp 17 | Ht 66.5 in | Wt 165.0 lb

## 2024-08-22 DIAGNOSIS — Z79899 Other long term (current) drug therapy: Secondary | ICD-10-CM | POA: Insufficient documentation

## 2024-08-22 DIAGNOSIS — D751 Secondary polycythemia: Secondary | ICD-10-CM

## 2024-08-22 LAB — CMP (CANCER CENTER ONLY)
ALT: 34 U/L (ref 0–44)
AST: 21 U/L (ref 15–41)
Albumin: 4.2 g/dL (ref 3.5–5.0)
Alkaline Phosphatase: 76 U/L (ref 38–126)
Anion gap: 7 (ref 5–15)
BUN: 13 mg/dL (ref 8–23)
CO2: 28 mmol/L (ref 22–32)
Calcium: 9.2 mg/dL (ref 8.9–10.3)
Chloride: 104 mmol/L (ref 98–111)
Creatinine: 0.98 mg/dL (ref 0.61–1.24)
GFR, Estimated: >60 mL/min (ref >60)
Glucose, Bld: 110 mg/dL — ABNORMAL HIGH (ref 70–99)
Potassium: 4.5 mmol/L (ref 3.5–5.1)
Sodium: 139 mmol/L (ref 135–145)
Total Bilirubin: 1 mg/dL (ref 0.0–1.2)
Total Protein: 6.3 g/dL — ABNORMAL LOW (ref 6.5–8.1)

## 2024-08-22 LAB — CBC WITH DIFFERENTIAL (CANCER CENTER ONLY)
Abs Immature Granulocytes: 0.04 K/uL (ref 0.00–0.07)
Basophils Absolute: 0 K/uL (ref 0.0–0.1)
Basophils Relative: 0 %
Eosinophils Absolute: 0.1 K/uL (ref 0.0–0.5)
Eosinophils Relative: 1 %
HCT: 52.5 % — ABNORMAL HIGH (ref 39.0–52.0)
Hemoglobin: 18.5 g/dL — ABNORMAL HIGH (ref 13.0–17.0)
Immature Granulocytes: 0 %
Lymphocytes Relative: 7 %
Lymphs Abs: 0.8 K/uL (ref 0.7–4.0)
MCH: 33.2 pg (ref 26.0–34.0)
MCHC: 35.2 g/dL (ref 30.0–36.0)
MCV: 94.3 fL (ref 80.0–100.0)
Monocytes Absolute: 1.2 K/uL — ABNORMAL HIGH (ref 0.1–1.0)
Monocytes Relative: 11 %
Neutro Abs: 8.7 K/uL — ABNORMAL HIGH (ref 1.7–7.7)
Neutrophils Relative %: 81 %
Platelet Count: 113 K/uL — ABNORMAL LOW (ref 150–400)
RBC: 5.57 MIL/uL (ref 4.22–5.81)
RDW: 14.1 % (ref 11.5–15.5)
WBC Count: 10.8 K/uL — ABNORMAL HIGH (ref 4.0–10.5)
nRBC: 0 % (ref 0.0–0.2)

## 2024-08-23 LAB — ERYTHROPOIETIN: Erythropoietin: 11.7 m[IU]/mL (ref 2.6–18.5)

## 2024-08-25 ENCOUNTER — Ambulatory Visit (HOSPITAL_COMMUNITY)
Admission: RE | Admit: 2024-08-25 | Discharge: 2024-08-25 | Disposition: A | Discharge status: Home / Self Care | Source: Ambulatory Visit | Attending: Family Medicine | Admitting: Family Medicine

## 2024-08-25 DIAGNOSIS — R55 Syncope and collapse: Secondary | ICD-10-CM | POA: Insufficient documentation

## 2024-08-25 LAB — ECHOCARDIOGRAM COMPLETE
Area-P 1/2: 5.06 cm2
S' Lateral: 2.8 cm

## 2024-08-25 NOTE — Progress Notes (Unsigned)
 Patient ID: [name]                 DOB: [birth year]                    MRN: [id number]      HPI: [name] is a 63 y.o. male patient referred to lipid clinic by Dr. Barbaraann. PMH is significant for nonobstructive CAD (CAC score 141, 80th percentile on 07/14/24), OSA, and HTN.   Last lipid (02/27/24): TC 198, TG 76, HLD 41, and LDL 142. Was taking rosuvastatin  20 mg at the time, and dose was increased to 40 mg. Reports good adherence. Patient currently fasting. Will collect baseline lipid panel and Lp(a) today. Explained purpose and significance of Lp(a) to patient and genetic component of the marker.   Reviewed options for lowering LDL cholesterol, including PCSK-9 inhibitors and inclisiran.  Discussed mechanisms of action, dosing, side effects and potential decreases in LDL cholesterol. Also reviewed cost information and potential options for patient assistance.   Patient is familiar and interested in starting Repatha , as his wife has been taking medication without any issue. Tolerates rosuvastatin  40 mg well. Does not tolerated ezetimibe due to rash. Due to patient's past medical history and family history (father passed away at 67) and all his siblings has cholesterol problem too. will aim for LDL < 55. Informed patient that we will attempt to get Repatha  approved through insurance.  Patient inquired about recent lab results on hemoglobin. Discussed results with patient and recommended he call PCP to discuss further.   Current Medications: Rosuvastatin  40 daily  Intolerances: ezetimibe (rash) Risk Factors: family history, HTN, CAD.  LDL goal: < 55   Diet: Chicken and broccoli most days for dinner. Eats about about every 10 days and tries to eat healthier options. Good diet overall.   Exercise: Exercises four days/week. Active through work at [employer].   Family History: Family History  Problem Relation Age of Onset   Emphysema Mother    Asthma Mother    Heart  disease Father 64   Hyperlipidemia Other        family  hx   Hypertension Other        family hx   Stroke Other        family hx   Sudden death Other        family hx    Father passed away from cardiovascular disease at 26.   Social History: No smoking and minimal alcohol intake.   Labs:  Lipid Panel     Component Value Date/Time   CHOL 198 02/27/2024 1427   TRIG 76.0 02/27/2024 1427   HDL 41.10 02/27/2024 1427   CHOLHDL 5 02/27/2024 1427   VLDL 15.2 02/27/2024 1427   LDLCALC 142 (H) 02/27/2024 1427   LDLCALC 118 (H) 07/16/2020 0940   LDLDIRECT 166.6 10/16/2012 0805    Past Medical History:  Diagnosis Date   Allergy    History of nephrolithiasis    Hyperlipidemia    Hypertension    Sleep apnea     Current Outpatient Medications on File Prior to Visit  Medication Sig Dispense Refill   aspirin  EC 81 MG tablet Take 1 tablet (81 mg total) by mouth daily. 30 tablet    Eszopiclone  3 MG TABS Take 1 tablet (3 mg total) by mouth at bedtime. 90 tablet 1   methylphenidate  (METADATE  CD) 30 MG CR capsule Take 1 capsule (30 mg total) by mouth 2 (  two) times daily. 60 capsule 0   methylphenidate  (METADATE  CD) 30 MG CR capsule Take 1 capsule (30 mg total) by mouth 2 (two) times daily. 60 capsule 0   methylphenidate  (METADATE  CD) 30 MG CR capsule Take 1 capsule (30 mg total) by mouth 2 (two) times daily. 60 capsule 0   metoprolol  succinate (TOPROL  XL) 25 MG 24 hr tablet Take 1 tablet (25 mg total) by mouth daily. 90 tablet 3   rosuvastatin  (CRESTOR ) 40 MG tablet Take 1 tablet (40 mg total) by mouth daily. 90 tablet 3   tamsulosin  (FLOMAX ) 0.4 MG CAPS capsule Take 1 capsule (0.4 mg total) by mouth daily. 90 capsule 3   traZODone  (DESYREL ) 100 MG tablet Take 1 tablet (100 mg total) by mouth at bedtime. 90 tablet 3   No current facility-administered medications on file prior to visit.    Allergies  Allergen Reactions   Hydrocodone  Hives   Zetia [Ezetimibe] Rash     Assessment/Plan:  1. Hyperlipidemia -  Problem  HYPERLIPIDEMIA   Last lipid (02/27/24): TC 198, TG 76, HLD 41, and LDL 142. Was taking rosuvastatin  20 mg at the time, and dose was increased to 40 mg.  Current Medications: Rosuvastatin  40 daily  Intolerances: ezetimibe (rash) Risk Factors: family history, HTN, CAD.  LDL goal: < 55      HYPERLIPIDEMIA Assessment:  LDL goal: < 55 mg/dl last LDLc 857 mg/dl (4/71/74). Will re-check lipid panel and check Lp(a) today to get new baseline.  Tolerates rosuvastatin  40 mg without any issue. Does not tolerated ezetimibe.   Discussed next potential options (PCSK-9 inhibitors and inclisiran); cost, dosing efficacy, side effects   Plan: Continue taking current medications (rosuvastatin  40 mg) Collect baseline lipid panel and Lp(a) Will apply for PA for PCSK9i; will inform patient upon approval (prefers MyChart message) Lipid lab due in 2-3 months after starting PCSK9i   Thank you,  [name], Pharm.D Candidate   Robbi Blanch, [address]D [place] [facility]IR[facility]. [facility] [address], [place], [place] [address] Phone: [phone]; Fax: [phone]

## 2024-08-26 ENCOUNTER — Other Ambulatory Visit (HOSPITAL_COMMUNITY): Payer: Self-pay

## 2024-08-26 ENCOUNTER — Telehealth: Payer: Self-pay | Admitting: Pharmacist

## 2024-08-26 ENCOUNTER — Telehealth: Payer: Self-pay | Admitting: Pharmacy Technician

## 2024-08-26 ENCOUNTER — Ambulatory Visit: Admitting: Pharmacist

## 2024-08-26 ENCOUNTER — Encounter: Payer: Self-pay | Admitting: Pharmacist

## 2024-08-26 DIAGNOSIS — E782 Mixed hyperlipidemia: Secondary | ICD-10-CM

## 2024-08-26 NOTE — Assessment & Plan Note (Signed)
 Assessment:  LDL goal: < 55 mg/dl last LDLc 857 mg/dl (4/71/74). Will re-check lipid panel and check Lp(a) today to get new baseline.  Tolerates rosuvastatin  40 mg without any issue. Does not tolerated ezetimibe.   Discussed next potential options (PCSK-9 inhibitors and inclisiran); cost, dosing efficacy, side effects   Plan: Continue taking current medications (rosuvastatin  40 mg) Collect baseline lipid panel and Lp(a) Will apply for PA for PCSK9i; will inform patient upon approval (prefers MyChart message) Lipid lab due in 2-3 months after starting PCSK9i

## 2024-08-26 NOTE — Patient Instructions (Signed)
 Your Results:             Your most recent labs Goal  Total Cholesterol 198 < 200  Triglycerides 76 < 150  HDL (happy/good cholesterol) 41 > 40  LDL (lousy/bad cholesterol 142 < 55   Medication changes: We will start the process to get Repatha  covered by your insurance.  Once the prior authorization is complete, we will call you to let you know and confirm pharmacy information.   You will continue to take rosuvastatin  (Crestor ) 40 mg daily    Repatha  is a cholesterol medication that improved your body's ability to get rid of bad cholesterol known as LDL. It can lower your LDL up to 60%! It is an injection that is given under the skin every 2 weeks. The medication often requires a prior authorization from your insurance company. We will take care of submitting all the necessary information to your insurance company to get it approved. The most common side effects of Repatha  include runny nose, symptoms of the common cold, rarely flu or flu-like symptoms, back/muscle pain in about 3-4% of the patients, and redness, pain, or bruising at the injection site.  Lab orders: We want to repeat labs after 2-3 months.  We will send you a lab order to remind you once we get closer to that time.

## 2024-08-26 NOTE — Telephone Encounter (Signed)
 Assessing Repatha  coverage

## 2024-08-26 NOTE — Telephone Encounter (Signed)
 Pharmacy Patient Advocate Encounter   Received notification from Physician's Office that prior authorization for Repatha  is required/requested.   Insurance verification completed.   The patient is insured through [employer].   Per test claim: PA required; PA submitted to above mentioned insurance via Latent Key/confirmation #/EOC [id number] Status is pending

## 2024-08-27 ENCOUNTER — Ambulatory Visit: Payer: Self-pay | Admitting: Pharmacist

## 2024-08-27 ENCOUNTER — Other Ambulatory Visit (HOSPITAL_COMMUNITY): Payer: Self-pay

## 2024-08-27 LAB — LIPID PANEL
Chol/HDL Ratio: 2.6 ratio (ref 0.0–5.0)
Cholesterol, Total: 160 mg/dL (ref 100–199)
HDL: 61 mg/dL (ref >39)
LDL Chol Calc (NIH): 86 mg/dL (ref 0–99)
Triglycerides: 66 mg/dL (ref 0–149)
VLDL Cholesterol Cal: 13 mg/dL (ref 5–40)

## 2024-08-27 LAB — LIPOPROTEIN A (LPA): Lipoprotein (a): 29.2 nmol/L (ref <75.0)

## 2024-08-27 MED ORDER — REPATHA SURECLICK 140 MG/ML ~~LOC~~ SOAJ: 140.0000 mg | SUBCUTANEOUS | 3 refills | Status: AC

## 2024-08-27 NOTE — Telephone Encounter (Signed)
 LDLc still above the goal on statin so will add Repatha . Call to inform patient about the lab result and Repatha  PA approval. N/A LVM prescription sent to the [facility]. F/u lab due end of Feb

## 2024-08-27 NOTE — Addendum Note (Signed)
 Addended by: Morrison Mcbryar K on: 08/27/2024 09:37 AM   Modules accepted: Orders

## 2024-08-27 NOTE — Telephone Encounter (Signed)
 Pharmacy Patient Advocate Encounter  Received notification from [employer] that Prior Authorization for Repatha  has been APPROVED from 08/27/24 to 08/26/25. Ran test claim, Copay is $0.00- one month. This test claim was processed through [facility]- copay amounts may vary at other pharmacies due to pharmacy/plan contracts, or as the patient moves through the different stages of their insurance plan.   PA #/Case ID/Reference #: [id number]

## 2024-09-02 ENCOUNTER — Ambulatory Visit: Admitting: Internal Medicine

## 2024-09-03 ENCOUNTER — Ambulatory Visit: Admitting: Pulmonary Disease

## 2024-09-03 ENCOUNTER — Encounter: Payer: Self-pay | Admitting: Pulmonary Disease

## 2024-09-03 VITALS — BP 134/84 | HR 89 | Temp 98.2°F | Ht 66.0 in | Wt 166.0 lb

## 2024-09-03 DIAGNOSIS — G4700 Insomnia, unspecified: Secondary | ICD-10-CM

## 2024-09-03 DIAGNOSIS — R0981 Nasal congestion: Secondary | ICD-10-CM

## 2024-09-03 DIAGNOSIS — D751 Secondary polycythemia: Secondary | ICD-10-CM

## 2024-09-03 DIAGNOSIS — Z8669 Personal history of other diseases of the nervous system and sense organs: Secondary | ICD-10-CM

## 2024-09-03 DIAGNOSIS — R0683 Snoring: Secondary | ICD-10-CM

## 2024-09-03 DIAGNOSIS — F5104 Psychophysiologic insomnia: Secondary | ICD-10-CM

## 2024-09-03 DIAGNOSIS — Z79899 Other long term (current) drug therapy: Secondary | ICD-10-CM | POA: Insufficient documentation

## 2024-09-03 NOTE — Progress Notes (Signed)
 Established Patient Pulmonology Office Visit   Subjective:  Patient ID: [name], male    DOB: April 09, 1961  MRN: [id number]  CC:  Chief Complaint  Patient presents with   Consult    Hx of sleep apnea.  Snoring, frequent awakenings during the night.  Did use BIPAP around 2010.    HPI 63 y.o. male with hyperlipidemia, history of OSA diagnosis in ~2012, and polycythemia (Hgb as high as 19.3 in 01/2024) who presents for follow up.   Last seen 10/2022 in Pulmonary Clinic. He reportedly tried BiPAP and CPAP in the past around time of his diagnosis of OSA. Reports he did well with BiPAP and a full face mask, but at some point he was required to switch to CPAP and he reported subjective difficulty with the sensation of CPAP relative to BiPAP. His device was then reclaimed.  He has been on sedative hypnotic medications for his insomnia (presumably at least in part due to his untreated OSA, but also with a separate distinct contribution from anxiety/stress) for many years. Currently prescribed Lunesta  3 mg at bedtime. This was last prescribed 02/27/2024 and last filled 05/25/2024. He also has prescriptions for methylphenidate  ER 30 mg BID (last written and filled 08/18/2024, and prior to this last written/filled on 04/08/2024 and 06/30/2024 respectively). Most prescriptions have been written by his PCP Garnette Olmsted, though as recently as 12/2023 he also was prescribed Lunesta  and gabapentin and methylphenidate  by Olam Blackwater (Psychiatric NP).  At last Pulmonary visit in 10/2022, a HST was ordered to assess severity of his OSA as this was unknown (we have no formal documentation of his reported ~2012 HST). This was not completed. The patient reports he did not receive a phone call about this study.  In the context of his polycythemia, which he reports his hematologist suspects to be secondary to untreated OSA, he would like to be re-tested for OSA and to try PAP therapy again if he qualifies.  He does  experience nasal congestion. No history of nasal polyps. Uses saline spray only. Has not tried Flonase.  Sleep habits: - Bedtime: 9:30pm - Wake time: 4:20 am - Sleep latency: 30 mins (with Lunesta ). - Number of awakenings per night: 3-4x/night. - Estimated total sleep duration: 5 hrs would be a great night. - Waking headaches: No - Sleep aids: Lunesta  3 mg at bedtime. - Stimulant use: Methylphenidate  ER 30 mg BID. - H/o MVAs / drowsy driving: None. - Endorses loud snoring. - Wife uses CPAP with nasal pillows mask.  Comorbidities: - Hypertension: No - Diabetes: No - Obesity: No  Other: - GLP-1 use: No   Pulm Questionnaires:     09/03/2024    8:00 AM 10/30/2022   11:00 AM  Results of the Epworth flowsheet  Sitting and reading 1 2  Watching TV 0 1  Sitting, inactive in a public place (e.g. a theatre or a meeting) 0 1  As a passenger in a car for an hour without a break 0 0  Lying down to rest in the afternoon when circumstances permit 1 1  Sitting and talking to someone 0 0  Sitting quietly after a lunch without alcohol 0 0  In a car, while stopped for a few minutes in traffic 0 0  Total score 2 5     ROS   Current Outpatient Medications:    aspirin  EC 81 MG tablet, Take 1 tablet (81 mg total) by mouth daily., Disp: 30 tablet, Rfl:  Eszopiclone  3 MG TABS, Take 1 tablet (3 mg total) by mouth at bedtime., Disp: 90 tablet, Rfl: 1   Evolocumab  (REPATHA  SURECLICK) 140 MG/ML SOAJ, Inject 140 mg into the skin every 14 (fourteen) days., Disp: 6 mL, Rfl: 3   methylphenidate  (METADATE  CD) 30 MG CR capsule, Take 1 capsule (30 mg total) by mouth 2 (two) times daily., Disp: 60 capsule, Rfl: 0   methylphenidate  (METADATE  CD) 30 MG CR capsule, Take 1 capsule (30 mg total) by mouth 2 (two) times daily., Disp: 60 capsule, Rfl: 0   methylphenidate  (METADATE  CD) 30 MG CR capsule, Take 1 capsule (30 mg total) by mouth 2 (two) times daily., Disp: 60 capsule, Rfl: 0   metoprolol  succinate  (TOPROL  XL) 25 MG 24 hr tablet, Take 1 tablet (25 mg total) by mouth daily., Disp: 90 tablet, Rfl: 3   rosuvastatin  (CRESTOR ) 40 MG tablet, Take 1 tablet (40 mg total) by mouth daily., Disp: 90 tablet, Rfl: 3   tamsulosin  (FLOMAX ) 0.4 MG CAPS capsule, Take 1 capsule (0.4 mg total) by mouth daily., Disp: 90 capsule, Rfl: 3   traZODone  (DESYREL ) 100 MG tablet, Take 1 tablet (100 mg total) by mouth at bedtime., Disp: 90 tablet, Rfl: 3      Objective:  BP 134/84   Pulse 89   Temp 98.2 F (36.8 C) (Oral)   Ht 5' 6 (1.676 m)   Wt 166 lb (75.3 kg)   SpO2 97% Comment: room air  BMI 26.79 kg/m    Physical Exam Constitutional:      Appearance: Normal appearance. He is normal weight.  HENT:     Head: Normocephalic and atraumatic.     Nose: Congestion present.     Mouth/Throat:     Mouth: Mucous membranes are moist.     Pharynx: Oropharynx is clear.  Eyes:     General: No scleral icterus.    Conjunctiva/sclera: Conjunctivae normal.  Pulmonary:     Effort: Pulmonary effort is normal.     Breath sounds: Normal breath sounds.  Musculoskeletal:     Left lower leg: No edema.  Neurological:     Mental Status: He is alert and oriented to person, place, and time. Mental status is at baseline.  Psychiatric:        Mood and Affect: Mood normal.        Behavior: Behavior normal.        Thought Content: Thought content normal.      Diagnostic Review:  RAR:Ojdu metabolic panel Lab Results  Component Value Date   WBC 10.8 (H) 08/22/2024   HGB 18.5 (H) 08/22/2024   HCT 52.5 (H) 08/22/2024   MCV 94.3 08/22/2024   MCH 33.2 08/22/2024   RDW 14.1 08/22/2024   PLT 113 (L) 08/22/2024       Assessment & Plan:   Assessment & Plan Snoring HST ordered (will be faster to obtain than in-lab PSG and patient prefers to expedite diagnostic workup). If hypoxemia is significant on HST, would follow up with in-lab CPAP/BiLevel Titration rather than empiric CPAP prescription due to the patient's  marked polycythemia.  Orders:   Home sleep test; Future  Polycythemia May be secondary to hypoxemia associated with his presumed diagnosis of OSA. See above for evaluation plan. Orders:   Home sleep test; Future  Chronic insomnia Lunesta  3 mg at bedtime for now, prescribed by PCP. Re-evaluate necessity and/or possible CBT-I referral after presumed OSA is properly treated.    Nasal congestion Continue saline spray  PRN. Recommended trial of Flonase 2 sprays per side daily. Counseled patient about gradual/slow onset of effect (~1 month of regular usage).    Long-term current use of stimulant Methylphenidate  ER 30 mg BID prescribed by PCP. Initially recommended by Olam Blackwater (Psychiatric NP) for impaired concentration. Suspect impaired concentration to significantly improve with CPAP therapy should he indeed have OSA on his sleep study. Re-evaluate ongoing need after presumed OSA is treated.      Time spent on day of this encounter (includes time spent face-to-face with the patient as well as time spent the same day as the encounter reviewing existing data and notes, and/or documenting my findings and the plan of care): 40 minutes   No follow-ups on file.  Will call patient with result of HST and next steps, and schedule appropriate follow up at that time.   Lamar JINNY Dales, MD

## 2024-09-03 NOTE — Assessment & Plan Note (Signed)
 Lunesta  3 mg at bedtime for now, prescribed by PCP. Re-evaluate necessity and/or possible CBT-I referral after presumed OSA is properly treated.

## 2024-09-03 NOTE — Patient Instructions (Signed)
  You will receive a call to schedule your Sleep Study.  ----------   There is no need to schedule a follow up visit today. Our office will call you with the results and next steps, and you will schedule your next visit at that time.  ----------

## 2024-09-03 NOTE — Assessment & Plan Note (Signed)
 Methylphenidate  ER 30 mg BID prescribed by PCP. Initially recommended by Olam Blackwater (Psychiatric NP) for impaired concentration. Suspect impaired concentration to significantly improve with CPAP therapy should he indeed have OSA on his sleep study. Re-evaluate ongoing need after presumed OSA is treated.

## 2024-09-03 NOTE — Assessment & Plan Note (Addendum)
 May be secondary to hypoxemia associated with his presumed diagnosis of OSA. See above for evaluation plan. Orders:   Home sleep test; Future

## 2024-09-11 LAB — BCR-ABL1 FISH
Cells Analyzed: 200
Cells Counted: 200

## 2024-09-15 ENCOUNTER — Ambulatory Visit: Payer: Self-pay | Admitting: Physician Assistant

## 2024-09-16 ENCOUNTER — Telehealth: Payer: Self-pay

## 2024-09-16 NOTE — Telephone Encounter (Signed)
 Copied from CRM #[id number]. Topic: Clinical - Order For Equipment >> Sep 12, 2024  2:45 PM Joesph PARAS wrote: Reason for CRM: Patient is calling to state that he has heard nothing about a sleep study from [facility]. Please investigate and update patient OR switch sleep study to new company, as this is twice [employer] has dropped the ball per patient.   Please advise

## 2024-09-23 ENCOUNTER — Other Ambulatory Visit: Payer: Self-pay

## 2024-09-29 ENCOUNTER — Encounter

## 2024-09-29 ENCOUNTER — Other Ambulatory Visit: Payer: Self-pay

## 2024-09-29 DIAGNOSIS — D751 Secondary polycythemia: Secondary | ICD-10-CM

## 2024-09-29 DIAGNOSIS — R0683 Snoring: Secondary | ICD-10-CM

## 2024-10-08 ENCOUNTER — Other Ambulatory Visit (HOSPITAL_COMMUNITY): Payer: Self-pay

## 2024-10-08 ENCOUNTER — Telehealth: Payer: Self-pay | Admitting: Pharmacy Technician

## 2024-10-08 NOTE — Telephone Encounter (Signed)
 Pharmacy Patient Advocate Encounter   Received notification from CoverMyMeds that prior authorization for REPATHA  is required/requested.   Insurance verification completed.   The patient is insured through [facility] MEDICAID.   Per test claim: PA required; PA submitted to above mentioned insurance via Latent Key/confirmation #/EOC [id number] Status is pending

## 2024-10-09 NOTE — Telephone Encounter (Signed)
 Pharmacy Patient Advocate Encounter  Received notification from [facility] MEDICAID that Prior Authorization for repatha  has been DENIED.  See denial reason below. No denial letter attached in CMM. Will attach denial letter to Media tab once received.  He has new insurance and this insurance is requiring this:    I could not find that in the office visit note to send to insurance for an appeal. There was a list of reasons on the denial but the other reasons I could find in the notes that was sent to the insurance during prior authorization request.

## 2024-10-13 NOTE — Telephone Encounter (Signed)
" °  [employer] NEXT    Faxed appeal letter 10/13/24  "

## 2024-10-13 NOTE — Telephone Encounter (Signed)
" ° ° °  Faxed additional information about patient being on atorvastatin  in the past "

## 2024-10-14 ENCOUNTER — Other Ambulatory Visit (HOSPITAL_COMMUNITY): Payer: Self-pay

## 2024-10-16 ENCOUNTER — Other Ambulatory Visit (HOSPITAL_COMMUNITY): Payer: Self-pay

## 2024-10-16 ENCOUNTER — Encounter: Payer: Self-pay | Admitting: Physician Assistant

## 2024-10-16 NOTE — Telephone Encounter (Signed)
 Faxed again to check on status

## 2024-10-17 ENCOUNTER — Telehealth: Payer: Self-pay

## 2024-10-17 ENCOUNTER — Telehealth: Payer: Self-pay | Admitting: Pulmonary Disease

## 2024-10-17 DIAGNOSIS — G4733 Obstructive sleep apnea (adult) (pediatric): Secondary | ICD-10-CM

## 2024-10-17 NOTE — Telephone Encounter (Signed)
 Call patient  Sleep study result  Date of study: 09/29/2024  Impression: Moderate obstructive sleep apnea with severe oxygen desaturations. AHI of 27.1, O2 nadir of 70%.  Saturations below 88% for 52 minutes   Recommendation: DME referral  Recommend CPAP therapy for moderate obstructive sleep apnea.  Auto-titrating CPAP with pressure settings of 5-15 should be appropriate, along with heated humidification using the patient's preferred mask. Avoid alcohol, sedatives and other CNS depressants that may worsen sleep apnea and disrupt normal sleep architecture. Sleep hygiene should be reviewed to assess factors that may improve sleep quality. Weight management and regular exercise should be initiated or continued   Schedule follow-up in the office 4 to 6 weeks after initiation of treatment

## 2024-10-17 NOTE — Telephone Encounter (Signed)
 Sleep study is pending signature from Dr. Neda  ATC pt and VM is full routing to Dr. MALVA for signature

## 2024-10-17 NOTE — Telephone Encounter (Signed)
 Copied from CRM [id number]. Topic: Clinical - Lab/Test Results >> Oct 16, 2024 10:53 AM Rozanna MATSU wrote: Reason for CRM: pt stated he has not rec'd his results from his Home Sleep Study which was sent to [facility] and no results showing yet. He stated he will not call them again because the rep he spoke was very rude to him(from [employer]). He stated he mailed it back to them about two weeks ago. Is there anything we can do on our end. Pt would like a call from our clinic to see what the hold up is.  ATC pt x1. Unable to lvm as mb is full. PCC'S, can you look into where his HST results are?

## 2024-10-21 ENCOUNTER — Other Ambulatory Visit (HOSPITAL_COMMUNITY): Payer: Self-pay

## 2024-10-21 NOTE — Telephone Encounter (Signed)
 Study and recommendation routed to [facility]  clinical on 1/16

## 2024-10-22 NOTE — Telephone Encounter (Signed)
 I called and spoke with the [name] and notified of results/recs per Dr. MALVA  He verbalized understanding  He states that he will try CPAP, but he really prefers BIPAP- used in the past and then insurance changed  He wanted me to ask if we can order the BIPAP If not he is ok with proceeding the CPAP  Please advise, thanks!

## 2024-10-22 NOTE — Telephone Encounter (Signed)
 Duplicate encounter,

## 2024-10-23 ENCOUNTER — Other Ambulatory Visit (HOSPITAL_COMMUNITY): Payer: Self-pay

## 2024-10-27 ENCOUNTER — Other Ambulatory Visit: Payer: Self-pay | Admitting: Family Medicine

## 2024-10-28 ENCOUNTER — Other Ambulatory Visit: Payer: Self-pay | Admitting: Pulmonary Disease

## 2024-10-28 DIAGNOSIS — J9611 Chronic respiratory failure with hypoxia: Secondary | ICD-10-CM

## 2024-10-28 DIAGNOSIS — G4733 Obstructive sleep apnea (adult) (pediatric): Secondary | ICD-10-CM

## 2024-10-28 NOTE — Telephone Encounter (Signed)
 Stretch, Lamar PARAS, MD to [facility] Clinical     10/24/24  2:01 PM BiPAP is fine if his insurance will approve.   If insurance does not approve, will prescribe CPAP and he will then need to demonstrate clinical intolerance of CPAP (+/- superiority of BiPAP during an in-lab sleep study in terms of treatment of his OSA).   Prescription should be for:   Resmed AirCurve device Mode: Auto BiLevel Min EPAP: 5 cm H2O Max IPAP: 20 cm H2O PS: 4 cm H2O   Mask: P-10 nasal pillows mask + chin strap.   I have placed BIPAP order  Called pt and left detailed msg  Routing to [facility] so they can tell us  if we need to change order to CPAP if insurance denies BIPAP   Thanks!   Thanks!

## 2024-10-28 NOTE — Telephone Encounter (Signed)
Order has been sent to [employer] °

## 2024-10-30 ENCOUNTER — Other Ambulatory Visit (HOSPITAL_COMMUNITY): Payer: Self-pay

## 2024-10-30 NOTE — Telephone Encounter (Signed)
 Pharmacy Patient Advocate Encounter  Received notification from [facility] MEDICAID that Prior Authorization for repatha  has been APPROVED from 10/30/24 to 01/28/25

## 2024-10-30 NOTE — Telephone Encounter (Signed)
 Sent more info to [facility] 10/30/24

## 2024-11-04 ENCOUNTER — Ambulatory Visit: Payer: Self-pay

## 2024-11-04 ENCOUNTER — Telehealth: Payer: Self-pay | Admitting: Family Medicine

## 2024-11-04 ENCOUNTER — Encounter: Payer: Self-pay | Admitting: Family Medicine

## 2024-11-04 VITALS — Temp 99.0°F

## 2024-11-04 DIAGNOSIS — U071 COVID-19: Secondary | ICD-10-CM

## 2024-11-04 MED ORDER — NIRMATRELVIR/RITONAVIR (PAXLOVID)TABLET: 3.0000 | ORAL_TABLET | Freq: Two times a day (BID) | ORAL | 0 refills | Status: AC

## 2024-11-04 NOTE — Progress Notes (Signed)
 "  Subjective:    Patient ID: [name], male    DOB: 20-Jun-1961, 64 y.o.   MRN: [id number]  HPI Virtual Visit via Video Note  I connected with the patient on 11/04/24 at  3:30 PM EST by a video enabled telemedicine application and verified that I am speaking with the correct person using two identifiers.  Location patient: home Location provider:work or home office Persons participating in the virtual visit: patient, provider  I discussed the limitations of evaluation and management by telemedicine and the availability of in person appointments. The patient expressed understanding and agreed to proceed.   HPI: Here for 4 days of fever, body aches, a cough producing clear sputum, and mild SOB. He tested positive for Covid yesterday. He is drinking fluids.    ROS: See pertinent positives and negatives per HPI.  Past Medical History:  Diagnosis Date   Allergy    History of nephrolithiasis    Hyperlipidemia    Hypertension    Sleep apnea     Past Surgical History:  Procedure Laterality Date   APPENDECTOMY     IRRIGATION AND DEBRIDEMENT ABSCESS Left 12/13/2015   Procedure: IRRIGATION AND DEBRIDEMENT LEFT GLUTEAL HEMATOMA;  Surgeon: Bernarda Ned, MD;  Location: [facility];  Service: General;  Laterality: Left;   LUMBAR LAMINECTOMY     UVULECTOMY      Family History  Problem Relation Age of Onset   Emphysema Mother    Asthma Mother    Heart disease Father 59   Hyperlipidemia Other        family  hx   Hypertension Other        family hx   Stroke Other        family hx   Sudden death Other        family hx    Current Medications[1]  EXAM:  VITALS per patient if applicable:  GENERAL: alert, oriented, appears well and in no acute distress  HEENT: atraumatic, conjunttiva clear, no obvious abnormalities on inspection of external nose and ears  NECK: normal movements of the head and neck  LUNGS: on inspection no signs of respiratory distress, breathing rate  appears normal, no obvious gross SOB, gasping or wheezing  CV: no obvious cyanosis  MS: moves all visible extremities without noticeable abnormality  PSYCH/NEURO: pleasant and cooperative, no obvious depression or anxiety, speech and thought processing grossly intact  ASSESSMENT AND PLAN: Covid infection, treat with 5 days of Paxlovid .  Garnette Olmsted, MD  Discussed the following assessment and plan:  No diagnosis found.     I discussed the assessment and treatment plan with the patient. The patient was provided an opportunity to ask questions and all were answered. The patient agreed with the plan and demonstrated an understanding of the instructions.   The patient was advised to call back or seek an in-person evaluation if the symptoms worsen or if the condition fails to improve as anticipated.      Review of Systems     Objective:   Physical Exam        Assessment & Plan:       [1]  Current Outpatient Medications:    aspirin  EC 81 MG tablet, Take 1 tablet (81 mg total) by mouth daily., Disp: 30 tablet, Rfl:    Eszopiclone  3 MG TABS, TAKE 1 TABLET BY MOUTH AT BEDTIME AS NEEDED, Disp: 30 tablet, Rfl: 0   Evolocumab  (REPATHA  SURECLICK) 140 MG/ML SOAJ, Inject 140 mg into the  skin every 14 (fourteen) days., Disp: 6 mL, Rfl: 3   methylphenidate  (METADATE  CD) 30 MG CR capsule, Take 1 capsule (30 mg total) by mouth 2 (two) times daily., Disp: 60 capsule, Rfl: 0   methylphenidate  (METADATE  CD) 30 MG CR capsule, Take 1 capsule (30 mg total) by mouth 2 (two) times daily., Disp: 60 capsule, Rfl: 0   methylphenidate  (METADATE  CD) 30 MG CR capsule, Take 1 capsule (30 mg total) by mouth 2 (two) times daily., Disp: 60 capsule, Rfl: 0   metoprolol  succinate (TOPROL  XL) 25 MG 24 hr tablet, Take 1 tablet (25 mg total) by mouth daily., Disp: 90 tablet, Rfl: 3   rosuvastatin  (CRESTOR ) 40 MG tablet, Take 1 tablet (40 mg total) by mouth daily., Disp: 90 tablet, Rfl: 3   tamsulosin  (FLOMAX )  0.4 MG CAPS capsule, Take 1 capsule (0.4 mg total) by mouth daily., Disp: 90 capsule, Rfl: 3   traZODone  (DESYREL ) 100 MG tablet, Take 1 tablet (100 mg total) by mouth at bedtime., Disp: 90 tablet, Rfl: 3  "

## 2024-11-04 NOTE — Telephone Encounter (Signed)
 FYI Only or Action Required?: FYI only for provider: appointment scheduled on 11/04/24.  Patient was last seen in primary care on 08/18/2024 by Johnny Garnette LABOR, MD.  Called Nurse Triage reporting Covid Positive.  Symptoms began several days ago.  Interventions attempted: OTC medications: cold OTC meds and Allegra D .  Symptoms are: unchanged.  Triage Disposition: See HCP Within 4 Hours (Or PCP Triage)  Patient/caregiver understands and will follow disposition?: Yes  Message from [facility] sent at 11/04/2024  2:24 PM EST  Reason for Triage: tested positive covid today stuffy nose, heavy chest, cough, headache, fever. requesting paxlovid    Reason for Disposition  MILD difficulty breathing (e.g., minimal/no SOB at rest, SOB with walking, pulse < 100)  Answer Assessment - Initial Assessment Questions 1. SYMPTOMS: What is your main symptom or concern? (e.g., cough, fever, shortness of breath, muscle aches)     Chest cold  2. ONSET: When did the symptoms start?      Saturday  3. COUGH: Do you have a cough? If Yes, ask: How bad is the cough?       yes 4. FEVER: Do you have a fever? If Yes, ask: What is your temperature, how was it measured, and when did it start?     Low grade  5. BREATHING DIFFICULTY: Are you having any difficulty breathing? (e.g., normal; shortness of breath, wheezing, unable to speak)      Tightness  6. BETTER-SAME-WORSE: Are you getting better, staying the same or getting worse compared to yesterday?  If getting worse, ask, In what way?     Worse or same  7. OTHER SYMPTOMS: Do you have any other symptoms?  (e.g., chills, fatigue, headache, loss of smell or taste, muscle pain, sore throat)     Fatigue, sinus pressure and HA 8. COVID-19 DIAGNOSIS: How do you know that you have COVID? (e.g., positive lab test or self-test, diagnosed by doctor or NP/PA, symptoms after exposure).     Home test today  11. HIGH RISK DISEASE: Do you have any chronic  medical problems? (e.g., asthma, heart or lung disease, weak immune system, obesity, etc.)  Protocols used: COVID-19 - Diagnosed or Suspected-A-AH

## 2024-11-04 NOTE — Telephone Encounter (Unsigned)
 Copied from CRM #[id number]. Topic: General - Other >> Nov 04, 2024  3:55 PM Deaijah H wrote: Reason for CRM: Patient called in stating he was disconnected from video visit with nurse and did not get to speak with Dr. Johnny. Stated he would like to get it taken care of today so to please call

## 2024-11-04 NOTE — Telephone Encounter (Signed)
 Done

## 2024-11-04 NOTE — Telephone Encounter (Signed)
 Pt was connected back on the video with Dr Johnny for his visit

## 2024-11-07 ENCOUNTER — Telehealth: Payer: Self-pay

## 2024-11-07 ENCOUNTER — Other Ambulatory Visit: Payer: Self-pay

## 2024-11-07 DIAGNOSIS — G4733 Obstructive sleep apnea (adult) (pediatric): Secondary | ICD-10-CM

## 2024-11-07 NOTE — Telephone Encounter (Signed)
 Copied from CRM [id number]. Topic: Clinical - Order For Equipment >> Nov 06, 2024  4:01 PM Rozanna MATSU wrote: Reason for CRM: pt called stated [employer] has advised him that they do not accept his insurance. Pt needs another alternate company so he can get his equipment.   Reached out to [facility], advise to call insurance will reach out to [facility].

## 2024-11-07 NOTE — Telephone Encounter (Signed)
 Copied from CRM [id number]. Topic: Clinical - Order For Equipment >> Nov 06, 2024  4:01 PM Rozanna MATSU wrote: Reason for CRM: pt called stated [employer] has advised him that they do not accept his insurance. Pt needs another alternate company so he can get his equipment. >> Nov 07, 2024  9:59 AM [name] wrote: Patient states he spoke directly with his insurance agent who assisted him in finding a DME [employer] [phone] located in [address], [place], [place] [address] - he is requesting to have bipap order sent to them  - pt would like a call back with updates on this    [facility] will send new order

## 2024-11-07 NOTE — Telephone Encounter (Signed)
 Calling [employer]

## 2024-11-07 NOTE — Telephone Encounter (Signed)
 Called patient and [employer]. nfn

## 2024-11-07 NOTE — Telephone Encounter (Signed)
 Copied from CRM [id number]. Topic: Clinical - Order For Equipment >> Nov 06, 2024  4:01 PM Rozanna MATSU wrote: Reason for CRM: pt called stated [employer] has advised him that they do not accept his insurance. Pt needs another alternate company so he can get his equipment. >> Nov 07, 2024  9:59 AM [name] wrote: Patient states he spoke directly with his insurance agent who assisted him in finding a DME [employer] [phone] located in [address], [address], [place], [place] [address] - he is requesting to have bipap order sent to them  - pt would like a call back with updates on this   Order has been place to new dme -

## 2025-02-18 ENCOUNTER — Inpatient Hospital Stay

## 2025-02-18 ENCOUNTER — Inpatient Hospital Stay: Admitting: Physician Assistant
